# Patient Record
Sex: Female | Born: 1995 | Race: Black or African American | Hispanic: No | Marital: Single | Smoking: Former smoker
Health system: Southern US, Community
[De-identification: ages and names within clinical notes are randomized; demographics above are authoritative.]

## PROBLEM LIST (undated history)

## (undated) DIAGNOSIS — J45909 Unspecified asthma, uncomplicated: Secondary | ICD-10-CM

## (undated) DIAGNOSIS — Z789 Other specified health status: Secondary | ICD-10-CM

## (undated) HISTORY — DX: Other specified health status: Z78.9

## (undated) HISTORY — PX: NO PAST SURGERIES: SHX2092

---
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
## 2017-02-14 ENCOUNTER — Ambulatory Visit: Payer: Medicaid Other

## 2017-10-14 ENCOUNTER — Other Ambulatory Visit: Payer: Self-pay

## 2017-10-14 ENCOUNTER — Emergency Department (HOSPITAL_COMMUNITY)
Admission: EM | Admit: 2017-10-14 | Discharge: 2017-10-14 | Disposition: A | Discharge status: Home / Self Care | Payer: Medicaid Other | Attending: Emergency Medicine | Admitting: Emergency Medicine

## 2017-10-14 ENCOUNTER — Encounter (HOSPITAL_COMMUNITY): Payer: Self-pay | Admitting: Emergency Medicine

## 2017-10-14 ENCOUNTER — Emergency Department (HOSPITAL_COMMUNITY): Payer: Medicaid Other

## 2017-10-14 DIAGNOSIS — R102 Pelvic and perineal pain: Secondary | ICD-10-CM | POA: Diagnosis not present

## 2017-10-14 DIAGNOSIS — N739 Female pelvic inflammatory disease, unspecified: Secondary | ICD-10-CM | POA: Diagnosis not present

## 2017-10-14 DIAGNOSIS — Z79899 Other long term (current) drug therapy: Secondary | ICD-10-CM | POA: Diagnosis not present

## 2017-10-14 DIAGNOSIS — R1084 Generalized abdominal pain: Secondary | ICD-10-CM | POA: Diagnosis present

## 2017-10-14 DIAGNOSIS — R21 Rash and other nonspecific skin eruption: Secondary | ICD-10-CM | POA: Diagnosis not present

## 2017-10-14 DIAGNOSIS — N9489 Other specified conditions associated with female genital organs and menstrual cycle: Secondary | ICD-10-CM | POA: Diagnosis not present

## 2017-10-14 DIAGNOSIS — N730 Acute parametritis and pelvic cellulitis: Secondary | ICD-10-CM

## 2017-10-14 LAB — CBC
HCT: 37 % (ref 36.0–46.0)
HEMOGLOBIN: 11.8 g/dL — AB (ref 12.0–15.0)
MCH: 27.8 pg (ref 26.0–34.0)
MCHC: 31.9 g/dL (ref 30.0–36.0)
MCV: 87.3 fL (ref 78.0–100.0)
Platelets: 448 10*3/uL — ABNORMAL HIGH (ref 150–400)
RBC: 4.24 MIL/uL (ref 3.87–5.11)
RDW: 15.5 % (ref 11.5–15.5)
WBC: 9.5 10*3/uL (ref 4.0–10.5)

## 2017-10-14 LAB — URINALYSIS, ROUTINE W REFLEX MICROSCOPIC
Bacteria, UA: NONE SEEN
Bilirubin Urine: NEGATIVE
GLUCOSE, UA: NEGATIVE mg/dL
Hgb urine dipstick: NEGATIVE
Ketones, ur: 20 mg/dL — AB
Nitrite: NEGATIVE
PROTEIN: 30 mg/dL — AB
Specific Gravity, Urine: 1.032 — ABNORMAL HIGH (ref 1.005–1.030)
pH: 5 (ref 5.0–8.0)

## 2017-10-14 LAB — COMPREHENSIVE METABOLIC PANEL
ALBUMIN: 3.8 g/dL (ref 3.5–5.0)
ALK PHOS: 68 U/L (ref 38–126)
ALT: 10 U/L (ref 0–44)
ANION GAP: 8 (ref 5–15)
AST: 14 U/L — ABNORMAL LOW (ref 15–41)
BUN: 12 mg/dL (ref 6–20)
CALCIUM: 9.1 mg/dL (ref 8.9–10.3)
CO2: 24 mmol/L (ref 22–32)
CREATININE: 0.87 mg/dL (ref 0.44–1.00)
Chloride: 106 mmol/L (ref 98–111)
GFR calc Af Amer: >60 mL/min (ref >60)
GFR calc non Af Amer: >60 mL/min (ref >60)
GLUCOSE: 86 mg/dL (ref 70–99)
Potassium: 3.5 mmol/L (ref 3.5–5.1)
Sodium: 138 mmol/L (ref 135–145)
Total Bilirubin: 0.9 mg/dL (ref 0.3–1.2)
Total Protein: 8 g/dL (ref 6.5–8.1)

## 2017-10-14 LAB — WET PREP, GENITAL
SPERM: NONE SEEN
Trich, Wet Prep: NONE SEEN
YEAST WET PREP: NONE SEEN

## 2017-10-14 LAB — I-STAT BETA HCG BLOOD, ED (MC, WL, AP ONLY)

## 2017-10-14 LAB — LIPASE, BLOOD: Lipase: 30 U/L (ref 11–51)

## 2017-10-14 MED ORDER — HYDROCODONE-ACETAMINOPHEN 5-325 MG PO TABS
1.0000 | ORAL_TABLET | Freq: Once | ORAL | Status: AC
  Administered 2017-10-14: 1 via ORAL
  Filled 2017-10-14: qty 1

## 2017-10-14 MED ORDER — DOXYCYCLINE HYCLATE 100 MG PO CAPS: 100.0000 mg | ORAL_CAPSULE | Freq: Two times a day (BID) | ORAL | 0 refills | Status: DC

## 2017-10-14 MED ORDER — CEFTRIAXONE SODIUM 250 MG IJ SOLR
250.0000 mg | Freq: Once | INTRAMUSCULAR | Status: AC
  Administered 2017-10-14: 250 mg via INTRAMUSCULAR
  Filled 2017-10-14: qty 250

## 2017-10-14 MED ORDER — TRIAMCINOLONE ACETONIDE 0.5 % EX OINT
1.0000 | TOPICAL_OINTMENT | Freq: Two times a day (BID) | CUTANEOUS | 0 refills | Status: DC
Start: 2017-10-14 — End: 2020-03-04

## 2017-10-14 MED ORDER — DOXYCYCLINE HYCLATE 100 MG PO TABS
100.0000 mg | ORAL_TABLET | Freq: Once | ORAL | Status: AC
  Administered 2017-10-14: 100 mg via ORAL
  Filled 2017-10-14: qty 1

## 2017-10-14 MED ORDER — STERILE WATER FOR INJECTION IJ SOLN
INTRAMUSCULAR | Status: AC
  Filled 2017-10-14: qty 10

## 2017-10-14 MED ORDER — STERILE WATER FOR INJECTION IJ SOLN
0.9000 mL | Freq: Once | INTRAMUSCULAR | Status: AC
  Administered 2017-10-14: 0.9 mL via INTRAMUSCULAR

## 2017-10-14 NOTE — ED Notes (Signed)
Pt transported to US

## 2017-10-14 NOTE — ED Triage Notes (Addendum)
C/o sharp generalized abd pain x 2-3 days.  Denies nausea, vomiting, and diarrhea.  Also reports rash on rectum.  Denies urinary complaints.

## 2017-10-14 NOTE — ED Provider Notes (Signed)
Patient signed out to me by Sherlie BanK Gekas, PA-C.  Please see previous notes for further history.  In brief, patient presenting for evaluation of diffuse abdominal pain for several days.  She is concerned she may have an STD.  Additionally, states she has a rash on her buttocks.  On exam, patient had copious yellow discharge with cervical motion tenderness.  Labs and vital signs are reassuring.  Abdominal exam showed diffuse tenderness.  Pelvic ultrasound pending to rule out TOA or other concerning signs of infection.  Patient received Rocephin and doxycycline while in the hospital.  Plan for discharge with doxycycline and triamcinolone cream for infections.  Ultrasound shows no sign of TOA.  Shows dilated vessels concerning for pelvic congestion syndrome.  Discussed with patient.  Discussed if she is having long-term pain, she needs to follow-up with her OB/GYN for further evaluation.  Discussed importance of taking antibiotics.  At this time, patient appears safe for discharge.  Return precautions given.  Patient states she understands and agrees to plan.    Madison Soto, Madison Fritchman, PA-C 10/14/17 2252    Tegeler, Canary Brimhristopher J, MD 10/15/17 [phone]

## 2017-10-14 NOTE — Discharge Instructions (Addendum)
Take Doxycycline twice daily for the next two weeks for vaginal discharge and pelvic pain Use Triamcinolone cream twice daily on rash  Return if worsening Follow up with ob/gyn if you are having long-term pelvic pain

## 2017-10-14 NOTE — ED Provider Notes (Signed)
[facility] EMERGENCY DEPARTMENT Provider Note   CSN: [id number] Arrival date & time: 10/14/17  1716     History   Chief Complaint Chief Complaint  Patient presents with  . Abdominal Pain    HPI Madison Soto is a 22 y.o. female who presents with abdominal pain. No significant PMH. She states that for the past couple of days she has had constant, gradually worsening, generalized abdominal pain. She states it feels like "knots". She also has a rash over her buttocks which is somewhat painful, burning, and itchy. She started googling things and is concerned for STD. She reports copious malodorous vaginal discharge. She has had recent unprotected sex. She denies hx of STD. No fever, vomiting, diarrhea, urinary symptoms. LMP was a week ago.  HPI  Past Medical History:  Diagnosis Date  . Medical history non-contributory     There are no active problems to display for this patient.   Past Surgical History:  Procedure Laterality Date  . NO PAST SURGERIES       OB History    Gravida  1   Para  1   Term  1   Preterm      AB      Living  1     SAB      TAB      Ectopic      Multiple  0   Live Births  1            Home Medications    Prior to Admission medications   Medication Sig Start Date End Date Taking? Authorizing Provider  ibuprofen (ADVIL,MOTRIN) 600 MG tablet Take 1 tablet (600 mg total) by mouth every 6 (six) hours. 08/03/16   Leftwich-Kirby, Wilmer Floor, CNM  Prenatal Vit-Fe Fumarate-FA (PRENATAL MULTIVITAMIN) TABS tablet Take 1 tablet by mouth daily at 12 noon.    [provider]    Family History Family History  Problem Relation Age of Onset  . Hypertension Paternal Grandmother   . Hypertension Paternal Grandfather     Social History Social History   Tobacco Use  . Smoking status: Former Games developer  . Smokeless tobacco: Never Used  Substance Use Topics  . Alcohol use: No  . Drug use: No     Allergies     Patient has no known allergies.   Review of Systems Review of Systems  Constitutional: Positive for appetite change. Negative for activity change, chills and fever.  Respiratory: Negative for shortness of breath.   Cardiovascular: Negative for chest pain.  Gastrointestinal: Positive for abdominal pain. Negative for diarrhea, nausea and vomiting.  Genitourinary: Positive for pelvic pain and vaginal discharge. Negative for difficulty urinating, dysuria, flank pain, frequency and vaginal bleeding.  All other systems reviewed and are negative.    Physical Exam Updated Vital Signs Ht 5\' 11"  (1.803 m)   Wt 68 kg   LMP 10/07/2017   BMI 20.92 kg/m   Physical Exam  Constitutional: She is oriented to person, place, and time. She appears well-developed and well-nourished. No distress.  Calm, cooperative. Well appearing  HENT:  Head: Normocephalic and atraumatic.  Eyes: Pupils are equal, round, and reactive to light. Conjunctivae are normal. Right eye exhibits no discharge. Left eye exhibits no discharge. No scleral icterus.  Neck: Normal range of motion.  Cardiovascular: Normal rate and regular rhythm.  Pulmonary/Chest: Effort normal and breath sounds normal. No respiratory distress.  Abdominal: Soft. Bowel sounds are normal. She exhibits no distension and no  mass. There is tenderness (generalized). There is no rebound and no guarding. No hernia.  Genitourinary:  Genitourinary Comments: Pelvic: No inguinal lymphadenopathy or inguinal hernia noted. Normal external genitalia. No pain with speculum insertion. Closed cervical os with normal appearance - no rash or lesions. Thick yellow discharge on speculum exam. On bimanual examination there is +CMT. No adnexal tenderness. Chaperone present during exam.  Rectal: Superficial erythematous rash consistent with intertrigo.   Neurological: She is alert and oriented to person, place, and time.  Skin: Skin is warm and dry.  Psychiatric: She has a  normal mood and affect. Her behavior is normal.  Nursing note and vitals reviewed.    ED Treatments / Results  Labs (all labs ordered are listed, but only abnormal results are displayed) Labs Reviewed  WET PREP, GENITAL - Abnormal; Notable for the following components:      Result Value   Clue Cells Wet Prep HPF POC PRESENT (*)    WBC, Wet Prep HPF POC MANY (*)    All other components within normal limits  COMPREHENSIVE METABOLIC PANEL - Abnormal; Notable for the following components:   AST 14 (*)    All other components within normal limits  CBC - Abnormal; Notable for the following components:   Hemoglobin 11.8 (*)    Platelets 448 (*)    All other components within normal limits  URINALYSIS, ROUTINE W REFLEX MICROSCOPIC - Abnormal; Notable for the following components:   APPearance HAZY (*)    Specific Gravity, Urine 1.032 (*)    Ketones, ur 20 (*)    Protein, ur 30 (*)    Leukocytes, UA MODERATE (*)    All other components within normal limits  LIPASE, BLOOD  RPR  HIV ANTIBODY (ROUTINE TESTING)  I-STAT BETA HCG BLOOD, ED ([facility], [facility], [facility] ONLY)  GC/CHLAMYDIA PROBE AMP ([place]) NOT AT [facility]    EKG None  Radiology No results found.  Procedures Procedures (including critical care time)  Medications Ordered in ED Medications  HYDROcodone-acetaminophen (NORCO/VICODIN) 5-325 MG per tablet 1 tablet (1 tablet Oral Given 10/14/17 2030)  cefTRIAXone (ROCEPHIN) injection 250 mg (250 mg Intramuscular Given 10/14/17 2140)  doxycycline (VIBRA-TABS) tablet 100 mg (100 mg Oral Given 10/14/17 2140)  sterile water (preservative free) injection 0.9 mL (0.9 mLs Intramuscular Given 10/14/17 2140)     Initial Impression / Assessment and Plan / ED Course  I have reviewed the triage vital signs and the nursing notes.  Pertinent labs & imaging results that were available during my care of the patient were reviewed by me and considered in my medical decision making (see chart for  details).  22 year old female with diffuse abdominal pain and vaginal discharge as well as rash on the buttocks for the past couple days. Her vitals are normal. Labs are unremarkable. UA has 20 ketones, moderate leukocytes, 30 protein, 1.032 specific gravity. On exam she has diffuse tenderness. Pelvic exam is remarkable for copious yellow discharge and +CMT. Will treat for PID and due to significant tenderness, will order Pelvic US to r/o TOA, torsion, etc. The rash on her buttocks appears consistent with intertrigo. She has been using Shea butter and vaseline without relief. Will rx Triamcinolone. At shift change, US is pending. [facility] PA-C who will f/u on results and dispo.  Final Clinical Impressions(s) / ED Diagnoses   Final diagnoses:  PID (acute pelvic inflammatory disease)  Rash and other nonspecific skin eruption    ED Discharge Orders  None       Beryle QuantGekas, Lynnette Pote Marie, PA-C 10/14/17 2209    Bethann BerkshireZammit, Joseph, MD 10/16/17 1213

## 2017-10-14 NOTE — ED Notes (Signed)
Patient transported to Ultrasound 

## 2017-10-15 LAB — GC/CHLAMYDIA PROBE AMP (~~LOC~~) NOT AT ARMC
Chlamydia: POSITIVE — AB
Neisseria Gonorrhea: NEGATIVE

## 2017-10-15 LAB — RPR: RPR Ser Ql: NONREACTIVE

## 2017-10-16 LAB — HIV ANTIBODY (ROUTINE TESTING W REFLEX): HIV SCREEN 4TH GENERATION: NONREACTIVE

## 2017-10-18 ENCOUNTER — Telehealth: Payer: Self-pay | Admitting: Student

## 2017-10-18 DIAGNOSIS — A749 Chlamydial infection, unspecified: Secondary | ICD-10-CM

## 2017-10-18 MED ORDER — AZITHROMYCIN 500 MG PO TABS: 1000.0000 mg | ORAL_TABLET | Freq: Once | ORAL | 0 refills | Status: AC

## 2017-10-18 NOTE — Telephone Encounter (Addendum)
Madison BaldingLauren Soto tested positive for  Chlamydia. Patient was called by RN and allergies and pharmacy confirmed. Rx sent to pharmacy of choice.   Madison HornLawrence, Madison Kubicki, NP 10/18/2017 10:10 AM        ----- Message from Kathe BectonLori S Berdik, RN sent at 10/18/2017  9:56 AM EDT ----- This patient tested positive for:  Chlamydia  She :"has NKDA", I have informed the patient of her results and confirmed her pharmacy is correct in her chart. Please send Rx.   Thank you,   Kathe BectonBerdik, Madison S, RN   [facility].

## 2018-02-14 ENCOUNTER — Other Ambulatory Visit: Payer: Self-pay

## 2018-02-18 ENCOUNTER — Other Ambulatory Visit (HOSPITAL_COMMUNITY)
Admission: RE | Admit: 2018-02-18 | Discharge: 2018-02-18 | Disposition: A | Discharge status: Home / Self Care | Payer: Medicaid Other | Source: Ambulatory Visit | Attending: Advanced Practice Midwife | Admitting: Advanced Practice Midwife

## 2018-02-18 ENCOUNTER — Ambulatory Visit (INDEPENDENT_AMBULATORY_CARE_PROVIDER_SITE_OTHER): Payer: Medicaid Other | Admitting: Advanced Practice Midwife

## 2018-02-18 ENCOUNTER — Encounter: Payer: Self-pay | Admitting: Advanced Practice Midwife

## 2018-02-18 VITALS — BP 93/60 | HR 86 | Ht 66.0 in | Wt 125.1 lb

## 2018-02-18 DIAGNOSIS — Z3009 Encounter for other general counseling and advice on contraception: Secondary | ICD-10-CM | POA: Insufficient documentation

## 2018-02-18 DIAGNOSIS — Z0000 Encounter for general adult medical examination without abnormal findings: Secondary | ICD-10-CM

## 2018-02-18 LAB — POCT PREGNANCY, URINE: Preg Test, Ur: NEGATIVE

## 2018-02-18 MED ORDER — MEDROXYPROGESTERONE ACETATE 150 MG/ML IM SUSP
150.0000 mg | Freq: Once | INTRAMUSCULAR | Status: AC
  Administered 2018-02-18: 150 mg via INTRAMUSCULAR

## 2018-02-18 MED ORDER — METRONIDAZOLE 500 MG PO TABS: 2000.0000 mg | ORAL_TABLET | Freq: Once | ORAL | 0 refills | Status: DC

## 2018-02-18 NOTE — Progress Notes (Addendum)
GYNECOLOGY ANNUAL PREVENTATIVE CARE ENCOUNTER NOTE  Subjective:   Madison Soto is a 22 y.o. [id number] female here for a routine annual gynecologic exam.  Current complaints: none.   Denies abnormal vaginal bleeding, discharge, pelvic pain, problems with intercourse or other gynecologic concerns.   Patient is interested in starting birth control today. She has been on depo in the past, and would like to resume this.   Patient is also concerned because she got a phone call that her partner had trich, and that a rx had been sent to her pharmacy. When she went to pick it up it was not there. Nothing in epic about patient getting a call from here. STD testing will be done today, but will send in RX for flagyl at this time.    Gynecologic History Patient's last menstrual period was 02/15/2018 (approximate). Contraception: none Last Pap: NA, age. Results were: NA Last mammogram: NA, age. Results were: NA  Obstetric History OB History  Gravida Para Term Preterm AB Living  1 1 1     1   SAB TAB Ectopic Multiple Live Births        0 1    # Outcome Date GA Lbr Len/2nd Weight Sex Delivery Anes PTL Lv  1 Term 08/02/16 [id number] 06:55 / 00:23 7 lb 6.5 oz (3.359 kg) M Vag-Spont EPI  LIV    Past Medical History:  Diagnosis Date  . Medical history non-contributory     Past Surgical History:  Procedure Laterality Date  . NO PAST SURGERIES      Current Outpatient Medications on File Prior to Visit  Medication Sig Dispense Refill  . ibuprofen (ADVIL,MOTRIN) 200 MG tablet Take 400-600 mg by mouth every 6 (six) hours as needed for headache (pain).    Marland Kitchen. doxycycline (VIBRAMYCIN) 100 MG capsule Take 1 capsule (100 mg total) by mouth 2 (two) times daily. (Patient not taking: Reported on 02/18/2018) 28 capsule 0  . ibuprofen (ADVIL,MOTRIN) 600 MG tablet Take 1 tablet (600 mg total) by mouth every 6 (six) hours. (Patient not taking: Reported on 10/14/2017) 30 tablet 0  . Prenatal MV & Min w/FA-DHA (PRENATAL  ADULT GUMMY/DHA/FA PO) Take 1 tablet by mouth daily.    Marland Kitchen. triamcinolone ointment (KENALOG) 0.5 % Apply 1 application topically 2 (two) times daily. (Patient not taking: Reported on 02/18/2018) 30 g 0   No current facility-administered medications on file prior to visit.     No Known Allergies  Social History   Socioeconomic History  . Marital status: Single    Spouse name: Not on file  . Number of children: Not on file  . Years of education: Not on file  . Highest education level: Not on file  Occupational History  . Not on file  Social Needs  . Financial resource strain: Not on file  . Food insecurity:    Worry: Not on file    Inability: Not on file  . Transportation needs:    Medical: Not on file    Non-medical: Not on file  Tobacco Use  . Smoking status: Former Games developermoker  . Smokeless tobacco: Never Used  Substance and Sexual Activity  . Alcohol use: No  . Drug use: No  . Sexual activity: Never    Birth control/protection: None  Lifestyle  . Physical activity:    Days per week: Not on file    Minutes per session: Not on file  . Stress: Not on file  Relationships  . Social connections:  Talks on phone: Not on file    Gets together: Not on file    Attends religious service: Not on file    Active member of club or organization: Not on file    Attends meetings of clubs or organizations: Not on file    Relationship status: Not on file  . Intimate partner violence:    Fear of current or ex partner: Not on file    Emotionally abused: Not on file    Physically abused: Not on file    Forced sexual activity: Not on file  Other Topics Concern  . Not on file  Social History Narrative  . Not on file    Family History  Problem Relation Age of Onset  . Hypertension Paternal Grandmother   . Hypertension Paternal Grandfather     The following portions of the patient's history were reviewed and updated as appropriate: allergies, current medications, past family history,  past medical history, past social history, past surgical history and problem list.  Review of Systems Pertinent items noted in HPI and remainder of comprehensive ROS otherwise negative.   Objective:  BP 93/60   Pulse 86   Ht 5\' 6"  (1.676 m)   Wt 125 lb 1.6 oz (56.7 kg)   LMP 02/15/2018 (Approximate)   BMI 20.19 kg/m    Physical Exam  Constitutional: She is oriented to person, place, and time and well-developed, well-nourished, and in no distress. No distress.  HENT:  Head: Normocephalic.  Cardiovascular: Normal rate.  Pulmonary/Chest: Effort normal.  Abdominal: Soft. There is no abdominal tenderness. There is no rebound.  Neurological: She is alert and oriented to person, place, and time.  Skin: Skin is warm and dry.  Psychiatric: Affect normal.  Nursing note and vitals reviewed.   Results for orders placed or performed in visit on 02/18/18 (from the past 24 hour(s))  Pregnancy, urine POC     Status: None   Collection Time: 02/18/18  2:46 PM  Result Value Ref Range   Preg Test, Ur NEGATIVE NEGATIVE    Assessment and Plan:  1. Well woman exam (no gynecological exam)  2. Encounter for counseling regarding contraception - Cervicovaginal ancillary only( [place]) - Start depo today - Return March 10-24 for repeat injection   Routine preventative health maintenance measures emphasized. Please refer to After Visit Summary for other counseling recommendations.   Thressa ShellerHeather Hogan DNP, CNM  02/18/18  2:43 PM

## 2018-02-18 NOTE — Patient Instructions (Signed)
Contraceptive Injection  A contraceptive injection is a shot that prevents pregnancy. It is also called the birth control shot. The shot contains the hormone progestin, which prevents pregnancy by:  · Stopping the ovaries from releasing eggs.  · Thickening cervical mucus to prevent sperm from entering the cervix.  · Thinning the lining of the uterus to prevent a fertilized egg from attaching to the uterus.  Contraceptive injections are given under the skin (subcutaneous) or into a muscle (intramuscular). For these shots to work, you must get one of them every 3 months (12 weeks) from a health care provider.  Tell a health care provider about:  · Any allergies you have.  · All medicines you are taking, including vitamins, herbs, eye drops, creams, and over-the-counter medicines.  · Any blood disorders you have.  · Any medical conditions you have.  · Whether you are pregnant or may be pregnant.  What are the risks?  Generally, this is a safe procedure. However, problems may occur, including:  · Mood changes or depression.  · Loss of bone density (osteoporosis) after long-term use.  · Blood clots.  · Higher risk of an egg being fertilized outside your uterus (ectopic pregnancy).This is rare.  What happens before the procedure?  · Your health care provider may do a routine physical exam.  · You may have a test to make sure you are not pregnant.  What happens during the procedure?  · The area where the shot will be given will be cleaned and sanitized with alcohol.  · A needle will be inserted into a muscle in your upper arm or buttock, or into the skin of your thigh or abdomen. The needle will be attached to a syringe with the medicine inside of it.  · The medicine will be pushed through the syringe and injected into your body.  · A small bandage (dressing) may be placed over the injection site.  What can I expect after the procedure?  · After the procedure, it is common to have:  ? Soreness around the injection site  for a couple of days.  ? Irregular menstrual bleeding.  ? Weight gain.  ? Breast tenderness.  ? Headaches.  ? Discomfort in your abdomen.  · Ask your health care provider whether you need to use an added method of birth control (backup contraception), such as a condom, sponge, or spermicide.  ? If the first shot is given 1-7 days after the start of your last period, you will not need backup contraception.  ? If the first shot is given at any other time during your menstrual cycle, you should avoid having sex or you will need backup contraception for 7 days after you receive the shot.  Follow these instructions at home:  General instructions    · Take over-the-counter and prescription medicines only as told by your health care provider.  · Do not massage the injection site.  · Track your menstrual periods so you will know if they become irregular.  · Always use a condom to protect against STIs (sexually transmitted infections).  · Make sure you schedule an appointment in time for your next shot, and mark it on your calendar. For the birth control to prevent pregnancy, you must get the injections every 3 months (12 weeks).  Lifestyle  · Do not use any products that contain nicotine or tobacco, such as cigarettes and e-cigarettes. If you need help quitting, ask your health care provider.  · Eat foods   that are high in calcium and vitamin D, such as milk, cheese, and salmon. Doing this may help with any loss in bone density that is caused by the contraceptive injection. Ask your health care provider for dietary recommendations.  Contact a health care provider if:  · You have nausea or vomiting.  · You have abnormal vaginal discharge or bleeding.  · You miss a period or you think you might be pregnant.  · You experience mood changes or depression.  · You feel dizzy or light-headed.  · You have leg pain.  Get help right away if:  · You have chest pain.  · You cough up blood.  · You have shortness of breath.  · You have a  severe headache that does not go away.  · You have numbness in any part of your body.  · You have slurred speech.  · You have vision problems.  · You have vaginal bleeding that is abnormally heavy or does not stop.  · You have severe pain in your abdomen.  · You have depression that does not get better with treatment.  If you ever feel like you may hurt yourself or others, or have thoughts about taking your own life, get help right away. You can go to your nearest emergency department or call:  · Your local emergency services (911 in the [place]  · A suicide crisis helpline, such as the National Suicide Prevention Lifeline at 1-800-273-8255. This is open 24 hours a day.  Summary  · A contraceptive injection is a shot that prevents pregnancy. It is also called the birth control shot.  · The shot is given under the skin (subcutaneous) or into a muscle (intramuscular).  · After this procedure, it is common to have soreness around the injection site for a couple of days.  · To prevent pregnancy, the shot must be given by a health care provider every 3 months (12 weeks).  · After you have the shot, ask your health care provider whether you need to use an added method of birth control (backup contraception), such as a condom, sponge, or spermicide.  This information is not intended to replace advice given to you by your health care provider. Make sure you discuss any questions you have with your health care provider.  Document Released: 10/09/2016 Document Revised: 10/09/2016 Document Reviewed: 10/09/2016  Elsevier Interactive Patient Education © 2019 [employer] Inc[employer]

## 2018-02-18 NOTE — Addendum Note (Signed)
Addended by: Henrietta DineNEAL, Bryana Froemming S on: 02/18/2018 03:15 PM   Modules accepted: Orders

## 2018-02-18 NOTE — Progress Notes (Signed)
Pt wants Depo

## 2018-02-19 LAB — CERVICOVAGINAL ANCILLARY ONLY
CHLAMYDIA, DNA PROBE: NEGATIVE
NEISSERIA GONORRHEA: NEGATIVE

## 2018-05-07 ENCOUNTER — Ambulatory Visit: Payer: Medicaid Other

## 2018-12-19 DIAGNOSIS — Z5181 Encounter for therapeutic drug level monitoring: Secondary | ICD-10-CM | POA: Diagnosis not present

## 2018-12-24 DIAGNOSIS — Z5181 Encounter for therapeutic drug level monitoring: Secondary | ICD-10-CM | POA: Diagnosis not present

## 2019-01-02 DIAGNOSIS — Z5181 Encounter for therapeutic drug level monitoring: Secondary | ICD-10-CM | POA: Diagnosis not present

## 2019-01-08 DIAGNOSIS — Z5181 Encounter for therapeutic drug level monitoring: Secondary | ICD-10-CM | POA: Diagnosis not present

## 2019-01-15 DIAGNOSIS — Z5181 Encounter for therapeutic drug level monitoring: Secondary | ICD-10-CM | POA: Diagnosis not present

## 2019-01-21 DIAGNOSIS — Z5181 Encounter for therapeutic drug level monitoring: Secondary | ICD-10-CM | POA: Diagnosis not present

## 2019-01-27 IMAGING — US US TRANSVAGINAL NON-OB
1 series · 13 of 25 positions shown · non-contrast
Comparison: None.

CLINICAL DATA: Initial evaluation for acute pelvic pain.

EXAM:
TRANSABDOMINAL AND TRANSVAGINAL ULTRASOUND OF PELVIS
DOPPLER ULTRASOUND OF OVARIES
TECHNIQUE: Both transabdominal and transvaginal ultrasound examinations of the
pelvis were performed. Transabdominal technique was performed for
global imaging of the pelvis including uterus, ovaries, adnexal
regions, and pelvic cul-de-sac.
It was necessary to proceed with endovaginal exam following the
transabdominal exam to visualize the uterus, endometrium, and
ovaries. Color and duplex Doppler ultrasound was utilized to
evaluate blood flow to the ovaries.

[Series 1: us transvaginal non-ob · 0.21mm/px · 73 acquisitions, 13 frames shown]
[im 1/73]
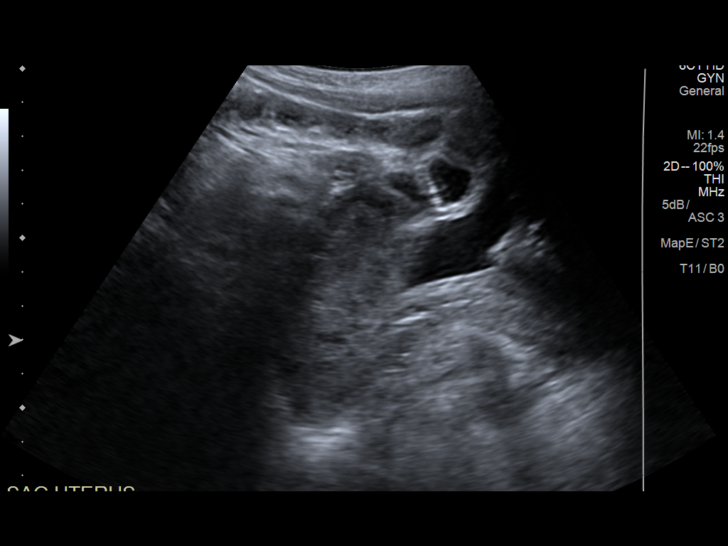
[im 7/73]
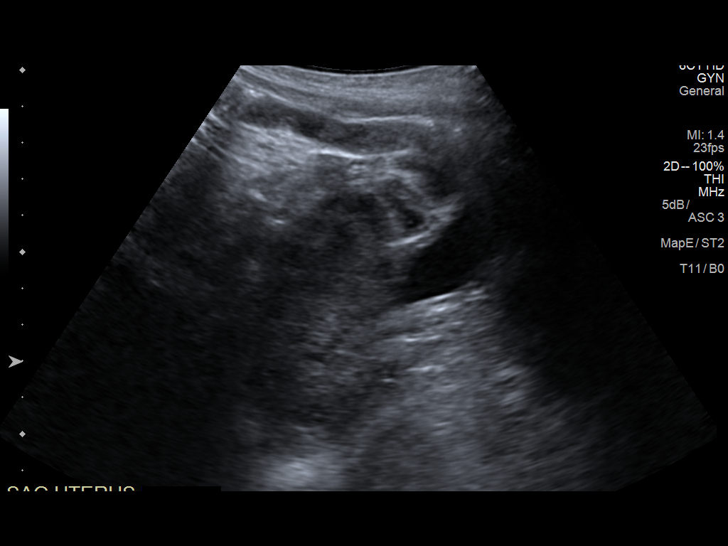
[im 13/73]
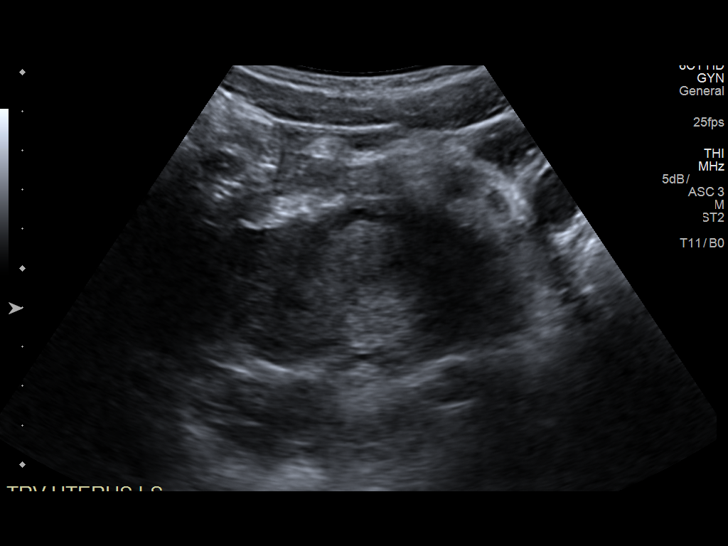
[im 19/73]
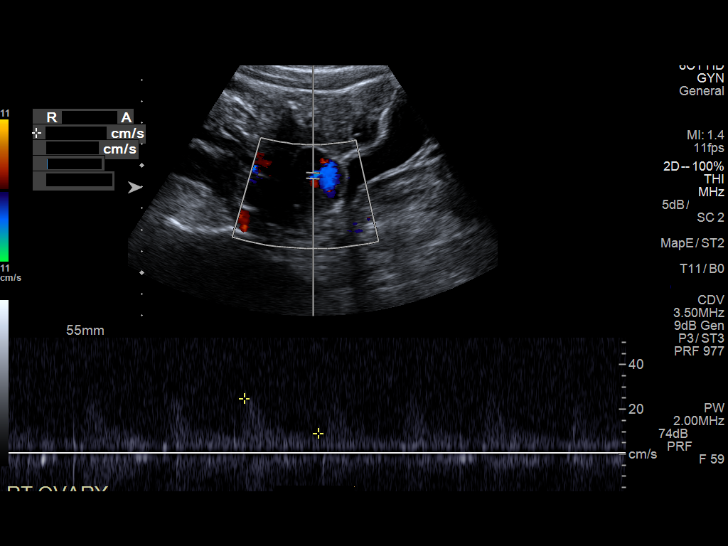
[im 25/73]
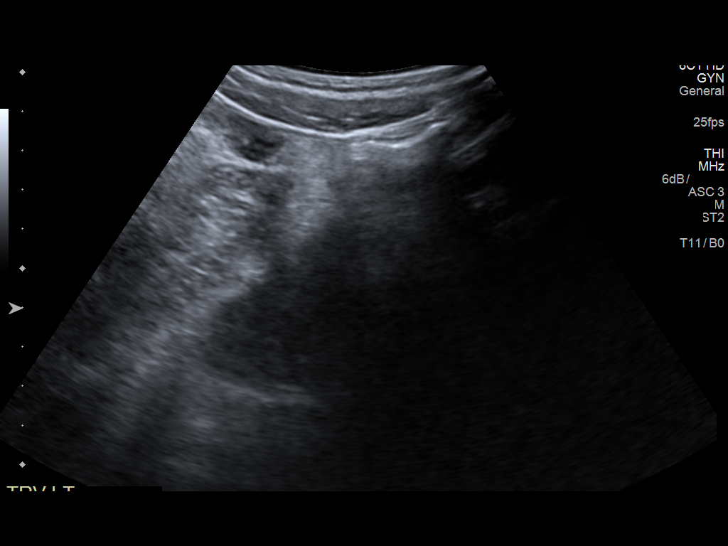
[im 31/73]
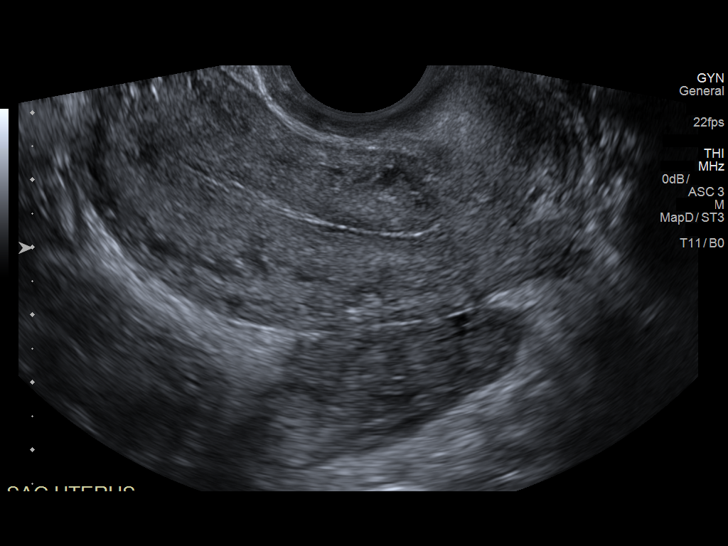
[im 37/73]
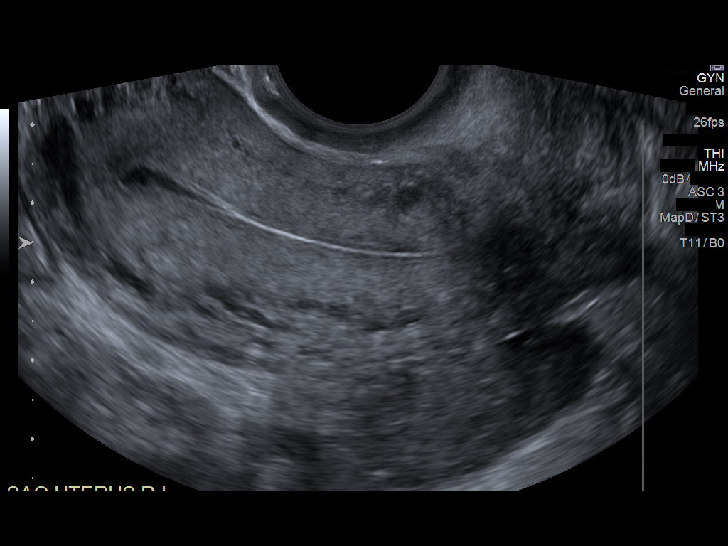
[im 43/73]
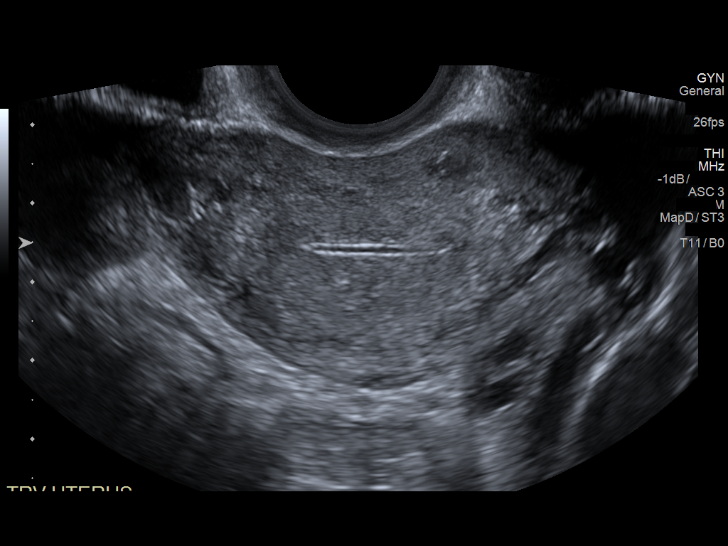
[im 49/73]
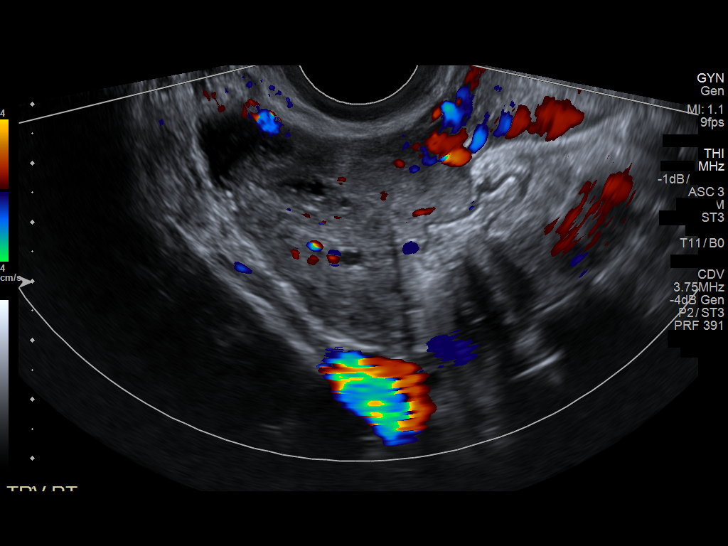
[im 55/73]
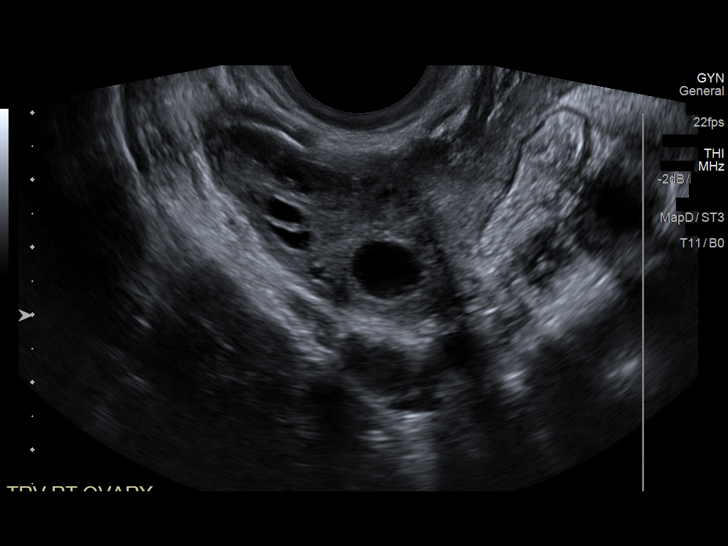
[im 61/73]
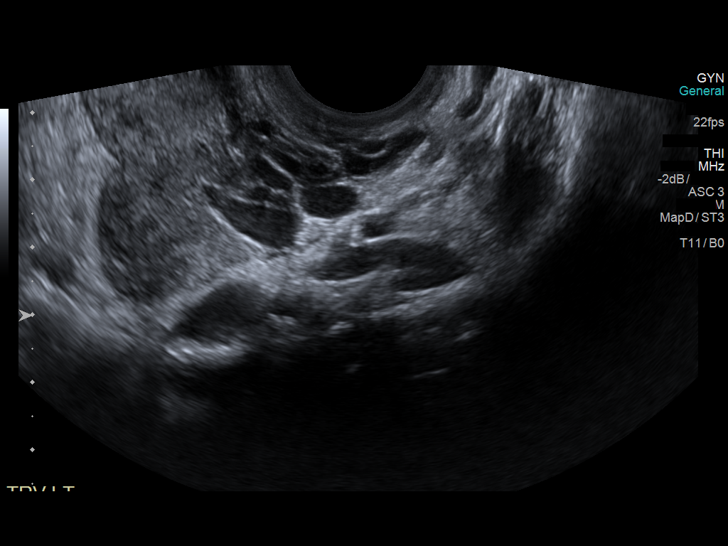
[im 67/73]
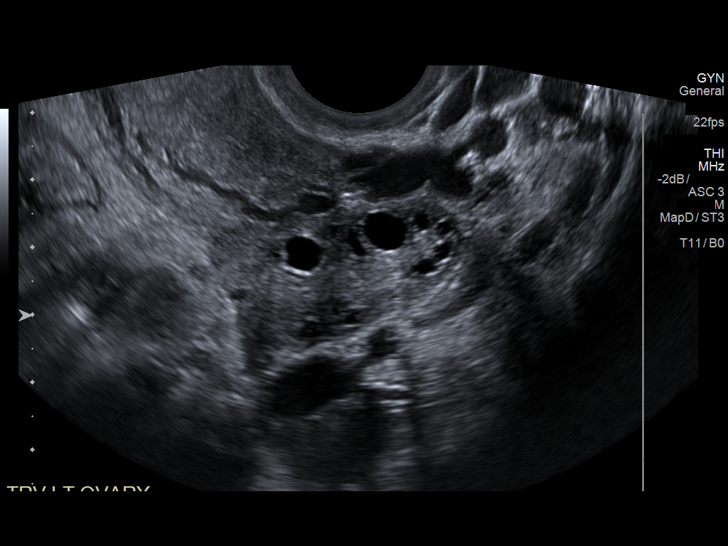
[im 73/73]
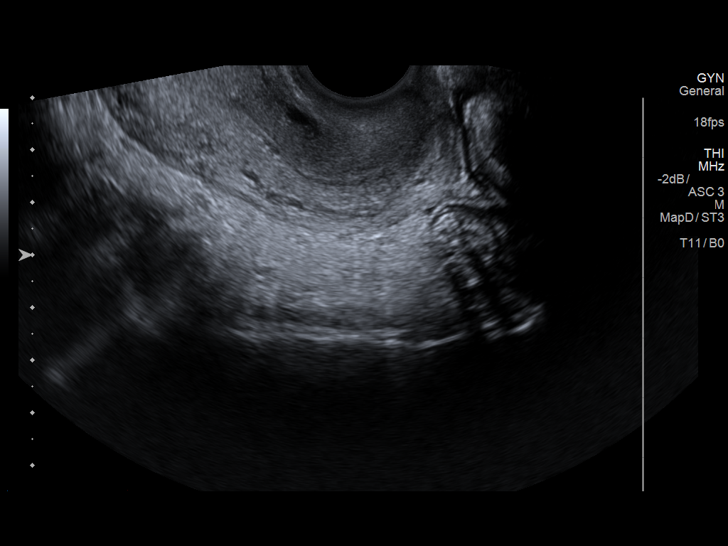

[13 of 25 positions shown; findings below may reference images not displayed]

FINDINGS: Uterus

Measurements: 7.4 x 3.1 x 4.6 cm. No fibroids or other mass
visualized.

Endometrium

Thickness: 2.1 mm.  No focal abnormality visualized.

Right ovary

Measurements: 2.2 x 2.3 x 2.1 cm. Normal appearance/no adnexal mass.
Small follicular cysts measuring up to 1.1 cm noted.

Left ovary

Measurements: 3.5 x 2.2 x 2.3 cm. Normal appearance/no adnexal mass.

Pulsed Doppler evaluation of both ovaries demonstrates normal
low-resistance arterial and venous waveforms.

Other findings

No abnormal free fluid.

Mildly prominent dilated vessel seen within the pelvic adnexa
bilaterally, which can be seen in the setting of pelvic congestion
syndrome.
IMPRESSION: 1. Mildly prominent dilated vessel seen within the pelvic adnexa
bilaterally, which can be seen in the setting of pelvic congestion
syndrome.
2. Otherwise normal pelvic ultrasound. No evidence for torsion or
other acute abnormality.

## 2019-01-28 DIAGNOSIS — Z5181 Encounter for therapeutic drug level monitoring: Secondary | ICD-10-CM | POA: Diagnosis not present

## 2019-02-04 DIAGNOSIS — Z5181 Encounter for therapeutic drug level monitoring: Secondary | ICD-10-CM | POA: Diagnosis not present

## 2019-02-07 ENCOUNTER — Other Ambulatory Visit: Payer: Self-pay

## 2019-02-07 DIAGNOSIS — Z20828 Contact with and (suspected) exposure to other viral communicable diseases: Secondary | ICD-10-CM | POA: Diagnosis not present

## 2019-02-07 DIAGNOSIS — Z20822 Contact with and (suspected) exposure to covid-19: Secondary | ICD-10-CM

## 2019-02-07 NOTE — Addendum Note (Signed)
Addended by: Luisa Dago A on: 02/07/2019 03:53 PM   Modules accepted: Orders

## 2019-02-08 LAB — NOVEL CORONAVIRUS, NAA: SARS-CoV-2, NAA: NOT DETECTED

## 2019-02-10 ENCOUNTER — Other Ambulatory Visit: Payer: Self-pay

## 2019-02-10 DIAGNOSIS — Z20828 Contact with and (suspected) exposure to other viral communicable diseases: Secondary | ICD-10-CM | POA: Diagnosis not present

## 2019-02-10 DIAGNOSIS — Z20822 Contact with and (suspected) exposure to covid-19: Secondary | ICD-10-CM

## 2019-02-11 DIAGNOSIS — Z5181 Encounter for therapeutic drug level monitoring: Secondary | ICD-10-CM | POA: Diagnosis not present

## 2019-02-12 LAB — NOVEL CORONAVIRUS, NAA: SARS-CoV-2, NAA: NOT DETECTED

## 2019-03-04 DIAGNOSIS — Z5181 Encounter for therapeutic drug level monitoring: Secondary | ICD-10-CM | POA: Diagnosis not present

## 2019-03-11 DIAGNOSIS — Z5181 Encounter for therapeutic drug level monitoring: Secondary | ICD-10-CM | POA: Diagnosis not present

## 2019-03-17 DIAGNOSIS — Z5181 Encounter for therapeutic drug level monitoring: Secondary | ICD-10-CM | POA: Diagnosis not present

## 2019-03-24 DIAGNOSIS — Z5181 Encounter for therapeutic drug level monitoring: Secondary | ICD-10-CM | POA: Diagnosis not present

## 2019-04-01 DIAGNOSIS — Z5181 Encounter for therapeutic drug level monitoring: Secondary | ICD-10-CM | POA: Diagnosis not present

## 2019-04-07 DIAGNOSIS — Z5181 Encounter for therapeutic drug level monitoring: Secondary | ICD-10-CM | POA: Diagnosis not present

## 2019-04-15 DIAGNOSIS — Z5181 Encounter for therapeutic drug level monitoring: Secondary | ICD-10-CM | POA: Diagnosis not present

## 2019-04-30 ENCOUNTER — Other Ambulatory Visit: Payer: Self-pay

## 2019-04-30 ENCOUNTER — Emergency Department (HOSPITAL_COMMUNITY)
Admission: EM | Admit: 2019-04-30 | Discharge: 2019-04-30 | Disposition: A | Discharge status: Home / Self Care | Payer: Medicaid Other | Attending: Emergency Medicine | Admitting: Emergency Medicine

## 2019-04-30 ENCOUNTER — Encounter (HOSPITAL_COMMUNITY): Payer: Self-pay

## 2019-04-30 DIAGNOSIS — N898 Other specified noninflammatory disorders of vagina: Secondary | ICD-10-CM

## 2019-04-30 DIAGNOSIS — Z87891 Personal history of nicotine dependence: Secondary | ICD-10-CM | POA: Insufficient documentation

## 2019-04-30 DIAGNOSIS — N899 Noninflammatory disorder of vagina, unspecified: Secondary | ICD-10-CM | POA: Diagnosis not present

## 2019-04-30 LAB — URINALYSIS, ROUTINE W REFLEX MICROSCOPIC
Bilirubin Urine: NEGATIVE
Glucose, UA: NEGATIVE mg/dL
Ketones, ur: NEGATIVE mg/dL
Leukocytes,Ua: NEGATIVE
Nitrite: NEGATIVE
Protein, ur: 30 mg/dL — AB
RBC / HPF: >50 RBC/hpf — ABNORMAL HIGH (ref 0–5)
Specific Gravity, Urine: 1.024 (ref 1.005–1.030)
pH: 7 (ref 5.0–8.0)

## 2019-04-30 LAB — WET PREP, GENITAL
Clue Cells Wet Prep HPF POC: NONE SEEN
Sperm: NONE SEEN
Trich, Wet Prep: NONE SEEN
Yeast Wet Prep HPF POC: NONE SEEN

## 2019-04-30 LAB — POC URINE PREG, ED: Preg Test, Ur: NEGATIVE

## 2019-04-30 LAB — HIV ANTIBODY (ROUTINE TESTING W REFLEX): HIV Screen 4th Generation wRfx: NONREACTIVE

## 2019-04-30 NOTE — ED Provider Notes (Signed)
[facility] EMERGENCY DEPARTMENT Provider Note   CSN: [id number] Arrival date & time: 04/30/19  1310     History Chief Complaint  Patient presents with  . Exposure to STD    Madison Soto is a 24 y.o. female.  HPI      Madison Soto is a 24 y.o. female, with a history of gonorrhea and chlamydia, presenting to the ED with change in vaginal discharge for the last two weeks.  She states, "I do not know it just looks a little different." Her boyfriend came in this morning to be tested for STDs and she would like to be tested as well. She is currently menstruating. Denies fever/chills, nausea/vomiting, abdominal pain, dyspareunia, abnormal vaginal bleeding, urinary symptoms, or any other complaints.      Past Medical History:  Diagnosis Date  . Medical history non-contributory     There are no problems to display for this patient.   Past Surgical History:  Procedure Laterality Date  . NO PAST SURGERIES       OB History    Gravida  1   Para  1   Term  1   Preterm      AB      Living  1     SAB      TAB      Ectopic      Multiple  0   Live Births  1           Family History  Problem Relation Age of Onset  . Hypertension Paternal Grandmother   . Hypertension Paternal Grandfather     Social History   Tobacco Use  . Smoking status: Former Games developer  . Smokeless tobacco: Never Used  Substance Use Topics  . Alcohol use: No  . Drug use: No    Home Medications Prior to Admission medications   Medication Sig Start Date End Date Taking? Authorizing Provider  doxycycline (VIBRAMYCIN) 100 MG capsule Take 1 capsule (100 mg total) by mouth 2 (two) times daily. Patient not taking: Reported on 02/18/2018 10/14/17   Bethel Born, PA-C  ibuprofen (ADVIL,MOTRIN) 200 MG tablet Take 400-600 mg by mouth every 6 (six) hours as needed for headache (pain).    [provider]  ibuprofen (ADVIL,MOTRIN) 600 MG tablet Take 1  tablet (600 mg total) by mouth every 6 (six) hours. Patient not taking: Reported on 10/14/2017 08/03/16   Hurshel Party, CNM  Prenatal MV & Min w/FA-DHA (PRENATAL ADULT GUMMY/DHA/FA PO) Take 1 tablet by mouth daily.    [provider]  triamcinolone ointment (KENALOG) 0.5 % Apply 1 application topically 2 (two) times daily. Patient not taking: Reported on 02/18/2018 10/14/17   Bethel Born, PA-C    Allergies    Patient has no known allergies.  Review of Systems   Review of Systems  Constitutional: Negative for chills and fever.  Respiratory: Negative for shortness of breath.   Cardiovascular: Negative for chest pain.  Gastrointestinal: Negative for abdominal pain, diarrhea, nausea and vomiting.  Genitourinary: Negative for dyspareunia, dysuria, flank pain, frequency, hematuria and pelvic pain.  Musculoskeletal: Negative for back pain.  Neurological: Negative for syncope and weakness.  All other systems reviewed and are negative.   Physical Exam Updated Vital Signs BP 111/80   Pulse 70   Temp 98.2 F (36.8 C) (Oral)   Resp 18   SpO2 96%   Physical Exam Vitals and nursing note reviewed.  Constitutional:  General: She is not in acute distress.    Appearance: She is well-developed. She is not diaphoretic.  HENT:     Head: Normocephalic and atraumatic.     Mouth/Throat:     Mouth: Mucous membranes are moist.     Pharynx: Oropharynx is clear.  Eyes:     Conjunctiva/sclera: Conjunctivae normal.  Cardiovascular:     Rate and Rhythm: Normal rate and regular rhythm.     Pulses: Normal pulses.          Radial pulses are 2+ on the right side and 2+ on the left side.     Comments: Tactile temperature in the extremities appropriate and equal bilaterally. Pulmonary:     Effort: Pulmonary effort is normal. No respiratory distress.  Abdominal:     Palpations: Abdomen is soft.     Tenderness: There is no abdominal tenderness. There is no guarding.   Genitourinary:    Comments: External genitalia normal Vagina with blood in the vaginal vault consistent with menstruation.  No abnormal discharge noted. Cervix  normal negative for cervical motion tenderness Adnexa palpated, no masses, negative for tenderness noted Bladder palpated negative for tenderness Uterus palpated no masses, negative for tenderness  No inguinal lymphadenopathy. Otherwise normal female genitalia. Med Tech, Inetta Fermo, served as Biomedical engineer during exam. Musculoskeletal:     Cervical back: Neck supple.     Right lower leg: No edema.     Left lower leg: No edema.  Skin:    General: Skin is warm and dry.  Neurological:     Mental Status: She is alert.  Psychiatric:        Mood and Affect: Mood and affect normal.        Speech: Speech normal.        Behavior: Behavior normal.     ED Results / Procedures / Treatments   Labs (all labs ordered are listed, but only abnormal results are displayed) Labs Reviewed  WET PREP, GENITAL - Abnormal; Notable for the following components:      Result Value   WBC, Wet Prep HPF POC FEW (*)    All other components within normal limits  URINALYSIS, ROUTINE W REFLEX MICROSCOPIC - Abnormal; Notable for the following components:   APPearance HAZY (*)    Hgb urine dipstick LARGE (*)    Protein, ur 30 (*)    RBC / HPF >50 (*)    Bacteria, UA RARE (*)    All other components within normal limits  RPR  HIV ANTIBODY (ROUTINE TESTING W REFLEX)  POC URINE PREG, ED  GC/CHLAMYDIA PROBE AMP ([place]) NOT AT [facility]    EKG None  Radiology No results found.  Procedures Pelvic exam  Date/Time: 04/30/2019 3:07 PM Performed by: Anselm Pancoast, PA-C Authorized by: Anselm Pancoast, PA-C  Consent: Verbal consent obtained. Risks and benefits: risks, benefits and alternatives were discussed Consent given by: patient Patient understanding: patient states understanding of the procedure being performed Patient identity confirmed: verbally with  patient and provided demographic data Local anesthesia used: no  Anesthesia: Local anesthesia used: no  Sedation: Patient sedated: no  Patient tolerance: patient tolerated the procedure well with no immediate complications    (including critical care time)  Medications Ordered in ED Medications - No data to display  ED Course  I have reviewed the triage vital signs and the nursing notes.  Pertinent labs & imaging results that were available during my care of the patient were reviewed by me and  considered in my medical decision making (see chart for details).    MDM Rules/Calculators/A&P                      Patient presents with change in vaginal discharge over the last couple weeks.  Pregnancy test negative.  Pelvic exam complicated by patient currently menstruating. STD test results pending.    Final Clinical Impression(s) / ED Diagnoses Final diagnoses:  Vaginal discharge    Rx / DC Orders ED Discharge Orders    None       Layla Maw 04/30/19 1544    Dorie Rank, MD 04/30/19 1900

## 2019-04-30 NOTE — ED Notes (Signed)
Pt discharged at this time. Discharge instructions reviewed, opportunity to ask questions provided, pt verbalizes understanding of discharge instructions.   

## 2019-04-30 NOTE — ED Triage Notes (Signed)
Pt requesting STD check, exposed to partner who was positive

## 2019-04-30 NOTE — ED Notes (Signed)
Got patient into a gown patient is resting with call bell in reach 

## 2019-04-30 NOTE — Discharge Instructions (Addendum)
Wet prep was reassuring. Test results for gonorrhea, chlamydia, HIV, and syphilis are pending.  If these results are abnormal typically they will be called to you. Follow-up with OB/GYN for any further management of this issue.

## 2019-05-01 LAB — GC/CHLAMYDIA PROBE AMP (~~LOC~~) NOT AT ARMC
Chlamydia: POSITIVE — AB
Neisseria Gonorrhea: POSITIVE — AB

## 2019-05-01 LAB — RPR: RPR Ser Ql: NONREACTIVE

## 2019-05-05 ENCOUNTER — Other Ambulatory Visit: Payer: Self-pay

## 2019-05-05 ENCOUNTER — Emergency Department (HOSPITAL_COMMUNITY)
Admission: EM | Admit: 2019-05-05 | Discharge: 2019-05-05 | Disposition: A | Discharge status: Home / Self Care | Payer: Medicaid Other | Attending: Emergency Medicine | Admitting: Emergency Medicine

## 2019-05-05 DIAGNOSIS — Z87891 Personal history of nicotine dependence: Secondary | ICD-10-CM | POA: Diagnosis not present

## 2019-05-05 DIAGNOSIS — R1030 Lower abdominal pain, unspecified: Secondary | ICD-10-CM | POA: Insufficient documentation

## 2019-05-05 DIAGNOSIS — A749 Chlamydial infection, unspecified: Secondary | ICD-10-CM | POA: Diagnosis not present

## 2019-05-05 DIAGNOSIS — A549 Gonococcal infection, unspecified: Secondary | ICD-10-CM | POA: Insufficient documentation

## 2019-05-05 DIAGNOSIS — N739 Female pelvic inflammatory disease, unspecified: Secondary | ICD-10-CM | POA: Diagnosis not present

## 2019-05-05 DIAGNOSIS — N898 Other specified noninflammatory disorders of vagina: Secondary | ICD-10-CM | POA: Insufficient documentation

## 2019-05-05 MED ORDER — DOXYCYCLINE HYCLATE 100 MG PO CAPS: 100.0000 mg | ORAL_CAPSULE | Freq: Two times a day (BID) | ORAL | 0 refills | Status: AC

## 2019-05-05 MED ORDER — LIDOCAINE HCL (PF) 1 % IJ SOLN
INTRAMUSCULAR | Status: AC
  Administered 2019-05-05: 1.5 mL
  Filled 2019-05-05: qty 5

## 2019-05-05 MED ORDER — CEFTRIAXONE SODIUM 500 MG IJ SOLR
500.0000 mg | Freq: Once | INTRAMUSCULAR | Status: AC
  Administered 2019-05-05: 500 mg via INTRAMUSCULAR
  Filled 2019-05-05: qty 500

## 2019-05-05 MED ORDER — DOXYCYCLINE HYCLATE 100 MG PO TABS
100.0000 mg | ORAL_TABLET | Freq: Once | ORAL | Status: AC
  Administered 2019-05-05: 100 mg via ORAL
  Filled 2019-05-05: qty 1

## 2019-05-05 NOTE — ED Notes (Signed)
Pt states she just got a call regarding her test results. Reports mild lower abdominal pain.

## 2019-05-05 NOTE — ED Triage Notes (Signed)
Pt says her MyChart account says she tested positive for gonorrhea and chlamydia and is here for tx. She has not received a call from the hospital with results.

## 2019-05-05 NOTE — Discharge Instructions (Addendum)
Follow up with [facility] Department STD clinic to be screened for HIV in the future and for future STD concerns or screenings. This is the recommendation by the CDC for people with multiple sexual partners or hx of STDs.   You are being treated for gonorrhea and chlamydia. Please take all of your antibiotics until finished!   Take your antibiotics with food.  Common side effects of antibiotics include nausea, vomiting, abdominal discomfort, and diarrhea. You may help offset some of this with probiotics which you can buy or get in yogurt. Do not eat  or take the probiotics until 2 hours after your antibiotic.    Some studies suggest that certain antibiotics can reduce the efficacy of certain oral contraceptive pills (birth control), so please use additional contraceptives (condoms or other barrier method) while you are taking the antibiotics and for an additional 5 to 7 days afterwards if you are a female on these medications.  You can take 1 to 2 tablets of Tylenol (350mg -1000mg  depending on the dose) every 6 hours as needed for pain.  Do not exceed 4000 mg of Tylenol daily.  If your pain persists you can take a dose of ibuprofen in between doses of Tylenol.  I usually recommend 400 to 600 mg of ibuprofen every 6 hours.  Take this with food to avoid upset stomach issues.  Abstain from sexual intercourse for 14 days while undergoing treatment.   Return to the emergency department if any concerning signs or symptoms develop such as fevers, persistent vomiting, severe worsening abdominal pain.

## 2019-05-05 NOTE — ED Notes (Signed)
Pt verbalized understanding of discharge instructions. Follow up care and prescriptions reviewed, pt had no further questions. 

## 2019-05-05 NOTE — ED Provider Notes (Signed)
[facility] EMERGENCY DEPARTMENT Provider Note   CSN: [id number] Arrival date & time: 05/05/19  1236     History Chief Complaint  Patient presents with  . SEXUALLY TRANSMITTED DISEASE    Madison Soto is a 24 y.o. female presents for evaluation of acute onset, persistent vaginal discharge for several days and lower abdominal pain since yesterday.  She was seen and evaluated in the ED on 04/30/2019 for abnormal vaginal discharge, noted that her significant other was seen same day with concern for STD so she wanted testing as well.  Today she saw on her MyChart that her gonorrhea and Chlamydia cultures were positive so she came seeking treatment.  She reports that her abnormal vaginal discharge is persistent, describes it as thick and clumpy.  Yesterday noticed development of constant aching lower abdominal pain.  Does not radiate, no aggravating or alleviating factors noted.  She denies any fever, nausea, vomiting, urinary symptoms, diarrhea, or constipation.  She reports that she and her significant other just started dating recently but reports planning on abstaining from any sexual intercourse until her treatment is completed.  The history is provided by the patient.       Past Medical History:  Diagnosis Date  . Medical history non-contributory     There are no problems to display for this patient.   Past Surgical History:  Procedure Laterality Date  . NO PAST SURGERIES       OB History    Gravida  1   Para  1   Term  1   Preterm      AB      Living  1     SAB      TAB      Ectopic      Multiple  0   Live Births  1           Family History  Problem Relation Age of Onset  . Hypertension Paternal Grandmother   . Hypertension Paternal Grandfather     Social History   Tobacco Use  . Smoking status: Former Games developer  . Smokeless tobacco: Never Used  Substance Use Topics  . Alcohol use: No  . Drug use: No    Home  Medications Prior to Admission medications   Medication Sig Start Date End Date Taking? Authorizing Provider  doxycycline (VIBRAMYCIN) 100 MG capsule Take 1 capsule (100 mg total) by mouth 2 (two) times daily for 14 days. 05/05/19 05/19/19  Michela Pitcher A, PA-C  ibuprofen (ADVIL,MOTRIN) 200 MG tablet Take 400-600 mg by mouth every 6 (six) hours as needed for headache (pain).    [provider]  ibuprofen (ADVIL,MOTRIN) 600 MG tablet Take 1 tablet (600 mg total) by mouth every 6 (six) hours. Patient not taking: Reported on 10/14/2017 08/03/16   Hurshel Party, CNM  Prenatal MV & Min w/FA-DHA (PRENATAL ADULT GUMMY/DHA/FA PO) Take 1 tablet by mouth daily.    [provider]  triamcinolone ointment (KENALOG) 0.5 % Apply 1 application topically 2 (two) times daily. Patient not taking: Reported on 02/18/2018 10/14/17   Bethel Born, PA-C    Allergies    Patient has no known allergies.  Review of Systems   Review of Systems  Constitutional: Negative for chills and fever.  Respiratory: Negative for shortness of breath.   Cardiovascular: Negative for chest pain.  Gastrointestinal: Positive for abdominal pain. Negative for constipation, diarrhea, nausea and vomiting.  Genitourinary: Positive for vaginal discharge. Negative for frequency,  vaginal bleeding and vaginal pain.  All other systems reviewed and are negative.   Physical Exam Updated Vital Signs BP 120/81 (BP Location: Right Arm)   Pulse 62   Temp 98.4 F (36.9 C) (Oral)   Resp 16   LMP 05/03/2019 (Exact Date)   SpO2 100%   Physical Exam Vitals and nursing note reviewed.  Constitutional:      General: She is not in acute distress.    Appearance: She is well-developed.     Comments: Resting very comfortably  HENT:     Head: Normocephalic and atraumatic.  Eyes:     General:        Right eye: No discharge.        Left eye: No discharge.     Conjunctiva/sclera: Conjunctivae normal.  Neck:      Vascular: No JVD.     Trachea: No tracheal deviation.  Cardiovascular:     Rate and Rhythm: Normal rate and regular rhythm.     Heart sounds: Normal heart sounds.  Pulmonary:     Effort: Pulmonary effort is normal.     Breath sounds: Normal breath sounds.  Abdominal:     General: Abdomen is flat. Bowel sounds are normal. There is no distension.     Palpations: Abdomen is soft.     Tenderness: There is abdominal tenderness. There is no right CVA tenderness, left CVA tenderness, guarding or rebound.     Comments: Mild generalized discomfort on palpation, no focal tenderness.  Genitourinary:    Comments: Deferred Musculoskeletal:     Cervical back: Normal range of motion and neck supple.  Skin:    General: Skin is warm and dry.     Findings: No erythema.  Neurological:     Mental Status: She is alert.  Psychiatric:        Behavior: Behavior normal.     ED Results / Procedures / Treatments   Labs (all labs ordered are listed, but only abnormal results are displayed) Labs Reviewed - No data to display  EKG None  Radiology No results found.  Procedures Procedures (including critical care time)  Medications Ordered in ED Medications  cefTRIAXone (ROCEPHIN) injection 500 mg (has no administration in time range)  doxycycline (VIBRA-TABS) tablet 100 mg (has no administration in time range)    ED Course  I have reviewed the triage vital signs and the nursing notes.  Pertinent labs & imaging results that were available during my care of the patient were reviewed by me and considered in my medical decision making (see chart for details).    MDM Rules/Calculators/A&P                      Patient presenting for evaluation of persistent vaginal discharge, noticed some mild generalized abdominal cramping yesterday.  Abdomen is soft with no rebound or guarding on examination, notes some generalized soreness.  She is afebrile and vital signs are stable.  She is overall quite  well-appearing.  Has had no nausea or vomiting.  Tested positive for gonorrhea and chlamydia after cultures were obtained 5 days ago.  She is here for treatment.  Given the abdominal discomfort she is experiencing I think it is reasonable to treat for PID.  I highly doubt acute surgical abdominal pathology, ovarian torsion, TOA, or ectopic pregnancy.  She just completed her menstrual cycle a few days ago and her pregnancy test was negative 5 days ago.  She received a dose of IM Rocephin  in the ED and will discharge with course of doxycycline.  Discussed safe sex practices and recommendations in place by the [facility] for further testing and treatment.  Recommend follow-up with health department for future testing and treatment.  Discussed strict ED return precautions. Patient verbalized understanding of and agreement with plan and is safe for discharge home at this time.   Final Clinical Impression(s) / ED Diagnoses Final diagnoses:  Vaginal discharge  Gonorrhea in female  Chlamydia infection    Rx / DC Orders ED Discharge Orders         Ordered    doxycycline (VIBRAMYCIN) 100 MG capsule  2 times daily     05/05/19 [address] [place] A, PA-C 05/05/19 1436    Pattricia Boss, MD 05/05/19 1905

## 2019-06-07 ENCOUNTER — Encounter (HOSPITAL_COMMUNITY): Payer: Self-pay | Admitting: Emergency Medicine

## 2019-06-07 ENCOUNTER — Other Ambulatory Visit: Payer: Self-pay

## 2019-06-07 ENCOUNTER — Emergency Department (HOSPITAL_COMMUNITY)
Admission: EM | Admit: 2019-06-07 | Discharge: 2019-06-07 | Disposition: A | Discharge status: Home / Self Care | Payer: Medicaid Other | Attending: Emergency Medicine | Admitting: Emergency Medicine

## 2019-06-07 DIAGNOSIS — N764 Abscess of vulva: Secondary | ICD-10-CM | POA: Diagnosis not present

## 2019-06-07 DIAGNOSIS — Z79899 Other long term (current) drug therapy: Secondary | ICD-10-CM | POA: Diagnosis not present

## 2019-06-07 DIAGNOSIS — Z87891 Personal history of nicotine dependence: Secondary | ICD-10-CM | POA: Insufficient documentation

## 2019-06-07 MED ORDER — CEPHALEXIN 500 MG PO CAPS: 500.0000 mg | ORAL_CAPSULE | Freq: Two times a day (BID) | ORAL | 0 refills | Status: AC

## 2019-06-07 MED ORDER — LIDOCAINE HCL (PF) 1 % IJ SOLN
INTRAMUSCULAR | Status: AC
  Administered 2019-06-07: 5 mL
  Filled 2019-06-07: qty 5

## 2019-06-07 NOTE — Discharge Instructions (Addendum)
Take the antibiotics as prescribed.  You may take Tylenol and ibuprofen as needed for pain.  It is very important that this abscess keeps draining.  You need this wound to remain open.  As such I would recommend warm baths 4 times daily and warm compress.  You will need wound recheck in 2 days.  This may be done at urgent care, OB/GYN, here in the emergency department.  Return here for any new or worsening symptoms

## 2019-06-07 NOTE — ED Notes (Signed)
Patient verbalized understanding of dc instructions, vss, ambulatory with nad.   

## 2019-06-07 NOTE — ED Triage Notes (Signed)
Patient reports worsening skin abscess at left groin onset last week , denies discharge /no fever or chills .

## 2019-06-07 NOTE — ED Provider Notes (Signed)
[facility] EMERGENCY DEPARTMENT Provider Note   CSN: [id number] Arrival date & time: 06/07/19  2703     History Chief Complaint  Patient presents with  . Abscess    Madison Soto is a 24 y.o. female with no significant past medical history who presents for evaluation of abscess.  Patient states she has recurrent abscesses.  Usually associated with shaving.  Was previously using Carlton Adam however recently started using razors again due to significant other preference.  Patient states she has had an abscess to left labia which started last week.  Notes some swelling and some tenderness.  No redness or warmth.  She denies any pelvic pain or vaginal discharge.  States these are similar to her prior abscesses.  She is not followed by OB/GYN.  Denies fever, chills, nausea, vomiting, chest pain, shortness of breath, abdominal pain, pelvic pain, vaginal discharge, concerns for STDs.  Denies aggravating or alleviating factors. Rates pain a 7/10 worse with palpation.  Last tetanus 2018  History obtained from patient and past medical records.  No interpreter is used  HPI     Past Medical History:  Diagnosis Date  . Medical history non-contributory     There are no problems to display for this patient.   Past Surgical History:  Procedure Laterality Date  . NO PAST SURGERIES       OB History    Gravida  1   Para  1   Term  1   Preterm      AB      Living  1     SAB      TAB      Ectopic      Multiple  0   Live Births  1           Family History  Problem Relation Age of Onset  . Hypertension Paternal Grandmother   . Hypertension Paternal Grandfather     Social History   Tobacco Use  . Smoking status: Former Research scientist (life sciences)  . Smokeless tobacco: Never Used  Substance Use Topics  . Alcohol use: No  . Drug use: No    Home Medications Prior to Admission medications   Medication Sig Start Date End Date Taking? Authorizing Provider  cephALEXin  (KEFLEX) 500 MG capsule Take 1 capsule (500 mg total) by mouth 2 (two) times daily for 5 days. 06/07/19 06/12/19  Jivan Symanski A, PA-C  ibuprofen (ADVIL,MOTRIN) 200 MG tablet Take 400-600 mg by mouth every 6 (six) hours as needed for headache (pain).    [provider]  ibuprofen (ADVIL,MOTRIN) 600 MG tablet Take 1 tablet (600 mg total) by mouth every 6 (six) hours. Patient not taking: Reported on 10/14/2017 08/03/16   Elvera Maria, CNM  Prenatal MV & Min w/FA-DHA (PRENATAL ADULT GUMMY/DHA/FA PO) Take 1 tablet by mouth daily.    [provider]  triamcinolone ointment (KENALOG) 0.5 % Apply 1 application topically 2 (two) times daily. Patient not taking: Reported on 02/18/2018 10/14/17   Recardo Evangelist, PA-C    Allergies    Patient has no known allergies.  Review of Systems   Review of Systems  Constitutional: Negative.   HENT: Negative.   Respiratory: Negative.   Cardiovascular: Negative.   Gastrointestinal: Negative.   Genitourinary: Negative for decreased urine volume, difficulty urinating, dysuria, flank pain, frequency, menstrual problem, pelvic pain, urgency, vaginal bleeding, vaginal discharge and vaginal pain.       Left labial pain and swelling  Musculoskeletal: Negative.   Skin: Negative.   Neurological: Negative.   All other systems reviewed and are negative.   Physical Exam Updated Vital Signs BP 111/64 (BP Location: Left Arm)   Pulse 79   Temp 98.2 F (36.8 C) (Oral)   Resp 18   Ht 5\' 6"  (1.676 m)   Wt 75 kg   LMP 05/29/2019   SpO2 97%   BMI 26.69 kg/m   Physical Exam Vitals and nursing note reviewed. Exam conducted with a chaperone present.  Constitutional:      General: She is not in acute distress.    Appearance: She is well-developed. She is not ill-appearing, toxic-appearing or diaphoretic.  HENT:     Head: Normocephalic and atraumatic.     Nose: Nose normal.     Mouth/Throat:     Mouth: Mucous membranes are moist.      Pharynx: Oropharynx is clear.  Eyes:     Pupils: Pupils are equal, round, and reactive to light.  Cardiovascular:     Rate and Rhythm: Normal rate.     Pulses: Normal pulses.     Heart sounds: Normal heart sounds.  Pulmonary:     Effort: Pulmonary effort is normal. No respiratory distress.     Breath sounds: Normal breath sounds.  Abdominal:     General: Bowel sounds are normal. There is no distension.  Genitourinary:    Labia:        Left: Tenderness and lesion present. No rash or injury.      Comments: Left labial fluctuance and induration between left labia and inguinal crease Musculoskeletal:        General: Normal range of motion.     Cervical back: Normal range of motion.  Skin:    General: Skin is warm and dry.     Capillary Refill: Capillary refill takes less than 2 seconds.     Comments: Fluctuance and  induration to left lateral labia.  Does not extend into Bartholin's area.  No lymphadenopathy.  No erythema or warmth.  Neurological:     Mental Status: She is alert.    ED Results / Procedures / Treatments   Labs (all labs ordered are listed, but only abnormal results are displayed) Labs Reviewed - No data to display  EKG None  Radiology No results found.  Procedures .06/01/2021Incision and Drainage  Date/Time: 06/07/2019 9:30 AM Performed by: 08/07/2019, PA-C Authorized by: Linwood Dibbles, PA-C   Consent:    Consent obtained:  Verbal   Consent given by:  Patient   Risks discussed:  Bleeding, incomplete drainage, pain and damage to other organs   Alternatives discussed:  No treatment Universal protocol:    Procedure explained and questions answered to patient or proxy's satisfaction: yes     Relevant documents present and verified: yes     Test results available and properly labeled: yes     Imaging studies available: yes     Required blood products, implants, devices, and special equipment available: yes     Site/side marked: yes     Immediately  prior to procedure a time out was called: yes     Patient identity confirmed:  Verbally with patient Location:    Type:  Abscess   Location:  Anogenital   Anogenital location: labial. Pre-procedure details:    Skin preparation:  Betadine Anesthesia (see MAR for exact dosages):    Anesthesia method:  Local infiltration   Local anesthetic:  Lidocaine 1% WITH epi  Procedure type:    Complexity:  Complex Procedure details:    Incision types:  Single straight   Incision depth:  Subcutaneous   Scalpel blade:  11   Wound management:  Probed and deloculated, irrigated with saline and extensive cleaning   Drainage:  Purulent   Drainage amount:  Copious   Wound treatment:  Wound left open   Packing materials:  None (Patient couldnt tolerate packing) Post-procedure details:    Patient tolerance of procedure:  Procedure terminated at patient's request   (including critical care time)  Medications Ordered in ED Medications  lidocaine (PF) (XYLOCAINE) 1 % injection (5 mLs  Given 06/07/19 0929)    ED Course  I have reviewed the triage vital signs and the nursing notes.  Pertinent labs & imaging results that were available during my care of the patient were reviewed by me and considered in my medical decision making (see chart for details).  57 old female presents for evaluation of labial abscess.  On exam she has fluctuance induration to her left labia.  Area not consistent with Bartholin's abscess or cyst.  No erythema or warmth.  Does not appear to extend deep into pelvis.  She denies any systemic symptoms.  Has history of similar.  Will attempt drainage.  Patient with labial abscess I&D here in ED.  Procedure initially tolerated well however patient could not tolerate packing. See procedure note. Wound left open.  Discussed warm sitz bath's, will start on antibiotics and have her follow-up outpatient in 2 days.  The patient has been appropriately medically screened and/or stabilized in the  ED. I have low suspicion for any other emergent medical condition which would require further screening, evaluation or treatment in the ED or require inpatient management.  Patient is hemodynamically stable and in no acute distress.  Patient able to ambulate in department prior to ED.  Evaluation does not show acute pathology that would require ongoing or additional emergent interventions while in the emergency department or further inpatient treatment.  I have discussed the diagnosis with the patient and answered all questions.  Pain is been managed while in the emergency department and patient has no further complaints prior to discharge.  Patient is comfortable with plan discussed in room and is stable for discharge at this time.  I have discussed strict return precautions for returning to the emergency department.  Patient was encouraged to follow-up with PCP/specialist refer to at discharge.   MDM Rules/Calculators/A&P                       Final Clinical Impression(s) / ED Diagnoses Final diagnoses:  Labial abscess    Rx / DC Orders ED Discharge Orders         Ordered    cephALEXin (KEFLEX) 500 MG capsule  2 times daily     06/07/19 0933           Strother Everitt A, PA-C 06/07/19 0934    Jacalyn Lefevre, MD 06/07/19 1413

## 2019-09-10 DIAGNOSIS — Z5181 Encounter for therapeutic drug level monitoring: Secondary | ICD-10-CM | POA: Diagnosis not present

## 2020-01-17 ENCOUNTER — Emergency Department (HOSPITAL_COMMUNITY)
Admission: EM | Admit: 2020-01-17 | Discharge: 2020-01-17 | Disposition: A | Discharge status: Home / Self Care | Payer: Medicaid Other | Attending: Emergency Medicine | Admitting: Emergency Medicine

## 2020-01-17 ENCOUNTER — Encounter (HOSPITAL_COMMUNITY): Payer: Self-pay | Admitting: *Deleted

## 2020-01-17 ENCOUNTER — Other Ambulatory Visit: Payer: Self-pay

## 2020-01-17 DIAGNOSIS — Z3A18 18 weeks gestation of pregnancy: Secondary | ICD-10-CM

## 2020-01-17 DIAGNOSIS — R1030 Lower abdominal pain, unspecified: Secondary | ICD-10-CM | POA: Diagnosis not present

## 2020-01-17 DIAGNOSIS — O26892 Other specified pregnancy related conditions, second trimester: Secondary | ICD-10-CM | POA: Diagnosis not present

## 2020-01-17 DIAGNOSIS — O2342 Unspecified infection of urinary tract in pregnancy, second trimester: Secondary | ICD-10-CM | POA: Diagnosis not present

## 2020-01-17 DIAGNOSIS — Z87891 Personal history of nicotine dependence: Secondary | ICD-10-CM | POA: Diagnosis not present

## 2020-01-17 DIAGNOSIS — O219 Vomiting of pregnancy, unspecified: Secondary | ICD-10-CM | POA: Insufficient documentation

## 2020-01-17 LAB — URINALYSIS, ROUTINE W REFLEX MICROSCOPIC
Bilirubin Urine: NEGATIVE
Glucose, UA: NEGATIVE mg/dL
Hgb urine dipstick: NEGATIVE
Ketones, ur: NEGATIVE mg/dL
Leukocytes,Ua: NEGATIVE
Nitrite: NEGATIVE
Protein, ur: NEGATIVE mg/dL
Specific Gravity, Urine: 1.018 (ref 1.005–1.030)
pH: 9 — ABNORMAL HIGH (ref 5.0–8.0)

## 2020-01-17 LAB — I-STAT BETA HCG BLOOD, ED (MC, WL, AP ONLY): I-stat hCG, quantitative: >2000 m[IU]/mL — ABNORMAL HIGH (ref <5)

## 2020-01-17 NOTE — Discharge Instructions (Addendum)
You your work-up today shows that you are pregnant.  It appears that you are 18 or 19 weeks judging by the height of your uterus.  [facility].  Please make sure you are taking prenatal vitamins.  Please keep the pregnancy verification letter in case she plan to apply for pregnancy Medicaid.

## 2020-01-17 NOTE — ED Triage Notes (Signed)
Patient states she has taken 2 preg test at home and they were both were neg however she states she has been vomiting off and on.

## 2020-01-17 NOTE — ED Provider Notes (Signed)
[facility] EMERGENCY DEPARTMENT Provider Note   CSN: [id number] Arrival date & time: 01/17/20  5364     History Chief Complaint  Patient presents with  . Abdominal Pain    Madison Soto is a 24 y.o. female who presents emergency department with chief complaint of pressure in her lower abdomen.  She states that she has had increased urinary frequency of late, she has also had some nausea and occasional vomiting.  She has taken 2 pregnancy tests at home both which were negative.  She has had some dysuria.  Denies flank pain or fevers.  Patient noticed some abdominal distention and felt like she has been gaining weight.  She has a history of irregular periods and is unsure of the date of her last menstrual cycle.  HPI     Past Medical History:  Diagnosis Date  . Medical history non-contributory     There are no problems to display for this patient.   Past Surgical History:  Procedure Laterality Date  . NO PAST SURGERIES       OB History    Gravida  1   Para  1   Term  1   Preterm      AB      Living  1     SAB      TAB      Ectopic      Multiple  0   Live Births  1           Family History  Problem Relation Age of Onset  . Hypertension Paternal Grandmother   . Hypertension Paternal Grandfather     Social History   Tobacco Use  . Smoking status: Former Games developer  . Smokeless tobacco: Never Used  Substance Use Topics  . Alcohol use: No  . Drug use: No    Home Medications Prior to Admission medications   Medication Sig Start Date End Date Taking? Authorizing Provider  ibuprofen (ADVIL,MOTRIN) 200 MG tablet Take 400-600 mg by mouth every 6 (six) hours as needed for headache (pain).    [provider]  ibuprofen (ADVIL,MOTRIN) 600 MG tablet Take 1 tablet (600 mg total) by mouth every 6 (six) hours. Patient not taking: Reported on 10/14/2017 08/03/16   Hurshel Party, CNM  Prenatal MV & Min w/FA-DHA (PRENATAL  ADULT GUMMY/DHA/FA PO) Take 1 tablet by mouth daily.    [provider]  triamcinolone ointment (KENALOG) 0.5 % Apply 1 application topically 2 (two) times daily. Patient not taking: Reported on 02/18/2018 10/14/17   Bethel Born, PA-C    Allergies    Patient has no known allergies.  Review of Systems   Review of Systems Ten systems reviewed and are negative for acute change, except as noted in the HPI.   Physical Exam Updated Vital Signs BP (!) 101/56 (BP Location: Left Arm)   Pulse 72   Temp 98.7 F (37.1 C) (Oral)   Resp 16   Ht 5\' 6"  (1.676 m)   Wt 68 kg   SpO2 99%   BMI 24.21 kg/m   Physical Exam Vitals and nursing note reviewed.  Constitutional:      General: She is not in acute distress.    Appearance: She is well-developed. She is not diaphoretic.  HENT:     Head: Normocephalic and atraumatic.  Eyes:     General: No scleral icterus.    Conjunctiva/sclera: Conjunctivae normal.  Cardiovascular:     Rate and Rhythm:  Normal rate and regular rhythm.     Heart sounds: Normal heart sounds. No murmur heard.  No friction rub. No gallop.   Pulmonary:     Effort: Pulmonary effort is normal. No respiratory distress.     Breath sounds: Normal breath sounds.  Abdominal:     General: Bowel sounds are normal. There is no distension.     Palpations: Abdomen is soft. There is no mass.     Tenderness: There is no abdominal tenderness. There is no guarding.     Comments: Gravid abdomen, Uterine fundus palpable to about 1-2 cm below the umbilicus  Musculoskeletal:     Cervical back: Normal range of motion.  Skin:    General: Skin is warm and dry.  Neurological:     Mental Status: She is alert and oriented to person, place, and time.  Psychiatric:        Behavior: Behavior normal.     ED Results / Procedures / Treatments   Labs (all labs ordered are listed, but only abnormal results are displayed) Labs Reviewed  I-STAT BETA HCG BLOOD, ED ([facility], [facility], [facility] ONLY)  - Abnormal; Notable for the following components:      Result Value   I-stat hCG, quantitative >2,000.0 (*)    All other components within normal limits    EKG None  Radiology No results found.  Procedures Ultrasound ED OB Pelvic  Date/Time: 01/17/2020 9:42 AM Performed by: Arthor Captain, PA-C Authorized by: Arthor Captain, PA-C   Procedure details:    Indications comment:  Urinary urgency, Pelvic distension r/o urinary retention vs, gravid uterus   Assess:  Intrauterine pregnancy   Images: not archived    Uterine findings:    Intrauterine pregnancy: identified     Single gestation: identified     Fetal heart rate: identified     Estimated gestational age: 2 weeks   (including critical care time)  Medications Ordered in ED Medications - No data to display  ED Course  I have reviewed the triage vital signs and the nursing notes.  Pertinent labs & imaging results that were available during my care of the patient were reviewed by me and considered in my medical decision making (see chart for details).  Clinical Course as of Jan 16 1302  Sat Jan 17, 2020  1012 Fetal heart tones 150-160   [AH]    Clinical Course User Index [AH] Arthor Captain, PA-C   MDM Rules/Calculators/A&P                          Patient here with urinary urgency.  Found to have a gravid abdomen.  Bedside ultrasound confirms that the patient has intrauterine gestation probably around 18 weeks.  Urine shows no evidence of infection.  I have discussed the case with the MAU provider who has [facility]. Final Clinical Impression(s) / ED Diagnoses Final diagnoses:  None    Rx / DC Orders ED Discharge Orders    None       Arthor Captain, PA-C 01/17/20 1304    Sabino Donovan, MD 01/18/20 [phone]

## 2020-01-19 ENCOUNTER — Telehealth: Payer: Self-pay | Admitting: *Deleted

## 2020-01-19 ENCOUNTER — Telehealth: Payer: Self-pay

## 2020-01-19 NOTE — Telephone Encounter (Signed)
-----   Message from Olevia Bowens sent at 01/19/2020  8:21 AM EST ----- Regarding: FW: New OB appointment  ----- Message ----- From: Rolm Bookbinder, CNM Sent: 01/17/2020  10:55 AM EST To: [facility] Subject: New OB appointment                             This patient was seen in the ED today and found to be approximately 18-20w pregnant. She has been seen at [facility] for gyn care in the past but wants to come to [facility] for prenatal care if she can. Can we call her to set up an appointment please?  Rayfield Citizen

## 2020-01-19 NOTE — Telephone Encounter (Signed)
Tried calling patient to schedule new OB appointment. Unable to leave voice message. Will send Mychart message.  Clovis Pu, RN

## 2020-01-19 NOTE — Telephone Encounter (Signed)
Transition Care Management Unsuccessful Follow-up Telephone Call  Date of discharge and from where:  01/17/2020 [facility]   Attempts:  1st Attempt  Reason for unsuccessful TCM follow-up call:  No answer/busy,no option to leave voicemail

## 2020-01-20 NOTE — Telephone Encounter (Signed)
Transition Care Management Unsuccessful Follow-up Telephone Call  Date of discharge and from where:  01/17/2020 [facility]   Attempts:  2nd Attempt  Reason for unsuccessful TCM follow-up call:  No answer/busy. No option to leave voicemail

## 2020-01-20 NOTE — Telephone Encounter (Signed)
Tried calling patient to schedule new OB appointment, unable to leave a voice message.  Clovis Pu, RN

## 2020-01-23 NOTE — Telephone Encounter (Signed)
Transition Care Management Unsuccessful Follow-up Telephone Call  Date of discharge and from where:  01/17/2020 [facility]  Attempts:  3rd Attempt  Reason for unsuccessful TCM follow-up call:  No answer/busy. No option to leave voicemail

## 2020-01-27 ENCOUNTER — Encounter (HOSPITAL_COMMUNITY): Payer: Self-pay | Admitting: Emergency Medicine

## 2020-01-27 ENCOUNTER — Other Ambulatory Visit: Payer: Self-pay

## 2020-01-27 ENCOUNTER — Ambulatory Visit (HOSPITAL_COMMUNITY)
Admission: EM | Admit: 2020-01-27 | Discharge: 2020-01-27 | Disposition: A | Discharge status: Home / Self Care | Payer: Medicaid Other | Attending: Family Medicine | Admitting: Family Medicine

## 2020-01-27 DIAGNOSIS — N764 Abscess of vulva: Secondary | ICD-10-CM

## 2020-01-27 MED ORDER — HIBICLENS 4 % EX LIQD: Freq: Every day | CUTANEOUS | 0 refills | Status: DC | PRN

## 2020-01-27 MED ORDER — ACETAMINOPHEN 325 MG PO TABS
650.0000 mg | ORAL_TABLET | Freq: Once | ORAL | Status: AC
  Administered 2020-01-27: 650 mg via ORAL

## 2020-01-27 MED ORDER — ACETAMINOPHEN 325 MG PO TABS
ORAL_TABLET | ORAL | Status: AC
  Filled 2020-01-27: qty 2

## 2020-01-27 MED ORDER — AMOXICILLIN 875 MG PO TABS: 875.0000 mg | ORAL_TABLET | Freq: Two times a day (BID) | ORAL | 0 refills | Status: DC

## 2020-01-27 NOTE — ED Triage Notes (Signed)
Abscess to left groin area, patient reports pain, altered gait due to pain.  Onset of symptoms 2 days ago.  Patient has a history of the same

## 2020-01-30 NOTE — ED Provider Notes (Signed)
[facility]    CSN: [id number] Arrival date & time: 01/27/20  1914      History   Chief Complaint Chief Complaint  Patient presents with  . Abscess    HPI Madison Soto is a 24 y.o. female.   Here today with 2 day hx of progressively worsening left labial abscess. Quickly becoming larger, more painful, and red per patient. Has had this issue once before and had to have the area drained. Barely able to sit or walk due to the pain at this time. Denies drainage, fever, chills, sweats. Has been using warm compresses without benefit.      Past Medical History:  Diagnosis Date  . Medical history non-contributory     There are no problems to display for this patient.   Past Surgical History:  Procedure Laterality Date  . NO PAST SURGERIES      OB History    Gravida  2   Para  1   Term  1   Preterm      AB      Living  1     SAB      TAB      Ectopic      Multiple  0   Live Births  1            Home Medications    Prior to Admission medications   Medication Sig Start Date End Date Taking? Authorizing Provider  amoxicillin (AMOXIL) 875 MG tablet Take 1 tablet (875 mg total) by mouth 2 (two) times daily. 01/27/20   Particia Nearing, PA-C  chlorhexidine (HIBICLENS) 4 % external liquid Apply topically daily as needed. 01/27/20   Particia Nearing, PA-C  ibuprofen (ADVIL,MOTRIN) 600 MG tablet Take 1 tablet (600 mg total) by mouth every 6 (six) hours. Patient not taking: Reported on 10/14/2017 08/03/16   Sharen Counter A, CNM  triamcinolone ointment (KENALOG) 0.5 % Apply 1 application topically 2 (two) times daily. Patient not taking: Reported on 02/18/2018 10/14/17   Bethel Born, PA-C    Family History Family History  Problem Relation Age of Onset  . Hypertension Paternal Grandmother   . Hypertension Paternal Grandfather     Social History Social History   Tobacco Use  . Smoking status: Former Games developer  .  Smokeless tobacco: Never Used  Substance Use Topics  . Alcohol use: No  . Drug use: No     Allergies   Patient has no known allergies.   Review of Systems Review of Systems PER HPI    Physical Exam Triage Vital Signs ED Triage Vitals  Enc Vitals Group     BP 01/27/20 2004 (!) 107/59     Pulse Rate 01/27/20 2004 81     Resp 01/27/20 2004 18     Temp 01/27/20 2004 99.2 F (37.3 C)     Temp Source 01/27/20 2004 Oral     SpO2 01/27/20 2004 99 %     Weight --      Height --      Head Circumference --      Peak Flow --      Pain Score 01/27/20 2001 10     Pain Loc --      Pain Edu? --      Excl. in GC? --    No data found.  Updated Vital Signs BP (!) 107/59 (BP Location: Left Arm)   Pulse 81   Temp 99.2 F (37.3 C) (Oral)  Resp 18   LMP 05/29/2019   SpO2 99%   Visual Acuity Right Eye Distance:   Left Eye Distance:   Bilateral Distance:    Right Eye Near:   Left Eye Near:    Bilateral Near:     Physical Exam Vitals and nursing note reviewed.  Constitutional:      Appearance: Normal appearance. She is not ill-appearing.  HENT:     Head: Atraumatic.  Eyes:     Extraocular Movements: Extraocular movements intact.     Conjunctiva/sclera: Conjunctivae normal.  Cardiovascular:     Rate and Rhythm: Normal rate and regular rhythm.     Heart sounds: Normal heart sounds.  Pulmonary:     Effort: Pulmonary effort is normal.     Breath sounds: Normal breath sounds.  Musculoskeletal:        General: Normal range of motion.     Cervical back: Normal range of motion and neck supple.  Skin:    General: Skin is warm and dry.     Comments: 1 cm x 3 cm firm labial abscess present, significantly ttp. No active drainage  Neurological:     Mental Status: She is alert and oriented to person, place, and time.  Psychiatric:        Mood and Affect: Mood normal.        Thought Content: Thought content normal.        Judgment: Judgment normal.      UC Treatments /  Results  Labs (all labs ordered are listed, but only abnormal results are displayed) Labs Reviewed - No data to display  EKG   Radiology No results found.  Procedures Incision and Drainage  Date/Time: 01/27/2020 8:00 PM Performed by: Particia Nearing, PA-C Authorized by: Particia Nearing, PA-C   Consent:    Consent obtained:  Verbal   Consent given by:  Patient   Risks discussed:  Bleeding, incomplete drainage and infection Location:    Type:  Abscess   Location: left labia. Pre-procedure details:    Skin preparation:  Chloraprep Anesthesia (see MAR for exact dosages):    Anesthesia method:  Local infiltration   Local anesthetic:  Lidocaine 2% w/o epi Procedure type:    Complexity:  Simple Procedure details:    Needle aspiration: no     Incision types:  Stab incision   Incision depth:  Dermal   Scalpel blade:  11   Wound management:  Probed and deloculated   Drainage:  Purulent   Drainage amount:  Moderate   Wound treatment:  Wound left open   Packing materials:  None Post-procedure details:    Patient tolerance of procedure:  Tolerated well, no immediate complications   (including critical care time)  Medications Ordered in UC Medications  acetaminophen (TYLENOL) tablet 650 mg (650 mg Oral Given 01/27/20 2028)    Initial Impression / Assessment and Plan / UC Course  I have reviewed the triage vital signs and the nursing notes.  Pertinent labs & imaging results that were available during my care of the patient were reviewed by me and considered in my medical decision making (see chart for details).     Abscess drained successfully without complication, discussed home wound care with hibiclens, neosporin, keeping area covered, using warm compresses off and on. Will tx with amoxil and have her f/u with GYN for recheck in 1 week. Recently found out she was 2nd trimester pregnant and will be following closely with GYN for this already.  Final  Clinical Impressions(s) / UC Diagnoses   Final diagnoses:  Left genital labial abscess   Discharge Instructions   None    ED Prescriptions    Medication Sig Dispense Auth. Provider   amoxicillin (AMOXIL) 875 MG tablet Take 1 tablet (875 mg total) by mouth 2 (two) times daily. 14 tablet Particia Nearing, [place]   chlorhexidine (HIBICLENS) 4 % external liquid Apply topically daily as needed. 120 mL Particia Nearing, [place]     PDMP not reviewed this encounter.   Roosvelt Maser [place], [place] 01/30/20 [phone]

## 2020-02-28 NOTE — L&D Delivery Note (Addendum)
OB Delivery Note Madison Soto is a 25 y.o. s/p vaginal delivery at [redacted]w[redacted]d.  She was admitted for IOL due to post-dates and non-reactive NST in the office.   ROM: 2h 1m, AROM with clear fluid GBS Status: Negative Maximum Maternal Temperature: 98.56F   Labor Progress: Patient's cervix was favorable on admission. She was started on pitocin and continued to progress well. AROM was performed for clear fluid. She was then noted to have complete cervical dilation and delivered without complication as noted below.   Delivery Date/Time: 05/20/2020 at 1150 Delivery: Called to room and patient was complete and pushing. Head delivered LOA with compound right hand. No nuchal cord present. Shoulder and body delivered in usual fashion. Infant with spontaneous cry, placed on mother's abdomen, dried and stimulated. Cord clamped x 2 after 1-minute delay, and cut by FOB under my direct supervision. Cord blood drawn. Placenta delivered spontaneously with gentle cord traction. Fundus firm with massage and Pitocin. Labia, perineum, vagina, and cervix were inspected; small bilateral periurethral lacerations noted, but were hemostatic and did not require repair.   Placenta: intact, 3-vessel cord, sent to L&D Complications: none Lacerations: hemostatic bilateral periurethral lacerations w/o need for repair EBL: 75 ml Analgesia: none   Infant: viable female  APGARs 7 & 9  weight per medical record  Maury Dus, MD  I was present and gloved for delivery of infant and placenta. I agree with resident's documentation as noted above.  Sheila Oats, MD OB Fellow, [facility] 05/20/2020 1:00 PM  Attestation of Attending Supervision of Obstetric Fellow: Evaluation and management procedures were performed by the Obstetric Fellow under my supervision and collaboration.  I have reviewed the Obstetric Fellow's note and chart, and I agree with the management and plan. I have also made any necessary editorial  changes.   Sharon Seller, DO Attending Obstetrician & Gynecologist, [facility] for [facility], [facility] 05/20/2020 1:54 PM

## 2020-03-04 ENCOUNTER — Ambulatory Visit (INDEPENDENT_AMBULATORY_CARE_PROVIDER_SITE_OTHER): Payer: Medicaid Other | Admitting: Obstetrics and Gynecology

## 2020-03-04 ENCOUNTER — Other Ambulatory Visit (HOSPITAL_COMMUNITY)
Admission: RE | Admit: 2020-03-04 | Discharge: 2020-03-04 | Disposition: A | Discharge status: Home / Self Care | Payer: Medicaid Other | Source: Ambulatory Visit | Attending: Obstetrics and Gynecology | Admitting: Obstetrics and Gynecology

## 2020-03-04 ENCOUNTER — Encounter: Payer: Self-pay | Admitting: Obstetrics and Gynecology

## 2020-03-04 ENCOUNTER — Other Ambulatory Visit: Payer: Self-pay

## 2020-03-04 VITALS — BP 108/61 | HR 94 | Temp 98.2°F | Wt 156.2 lb

## 2020-03-04 DIAGNOSIS — Z3480 Encounter for supervision of other normal pregnancy, unspecified trimester: Secondary | ICD-10-CM | POA: Insufficient documentation

## 2020-03-04 DIAGNOSIS — M5431 Sciatica, right side: Secondary | ICD-10-CM

## 2020-03-04 DIAGNOSIS — O0933 Supervision of pregnancy with insufficient antenatal care, third trimester: Secondary | ICD-10-CM

## 2020-03-04 DIAGNOSIS — Z3482 Encounter for supervision of other normal pregnancy, second trimester: Secondary | ICD-10-CM | POA: Diagnosis not present

## 2020-03-04 DIAGNOSIS — Z3A30 30 weeks gestation of pregnancy: Secondary | ICD-10-CM

## 2020-03-04 MED ORDER — PRENATE MINI 18-0.6-0.4-350 MG PO CAPS: 1.0000 | ORAL_CAPSULE | Freq: Every day | ORAL | 12 refills | Status: DC

## 2020-03-04 MED ORDER — PRENATE MINI 18-0.6-0.4-350 MG PO CAPS: 1.0000 | ORAL_CAPSULE | Freq: Every day | ORAL | 0 refills | Status: DC

## 2020-03-04 MED ORDER — COMFORT FIT MATERNITY SUPP SM MISC: 1.0000 [IU] | Freq: Every day | 0 refills | Status: DC | PRN

## 2020-03-04 NOTE — Patient Instructions (Addendum)
Sciatica  Sciatica is pain, numbness, weakness, or tingling along the path of the sciatic nerve. The sciatic nerve starts in the lower back and runs down the back of each leg. The nerve controls the muscles in the lower leg and in the back of the knee. It also provides feeling (sensation) to the back of the thigh, the lower leg, and the sole of the foot. Sciatica is a symptom of another medical condition that pinches or puts pressure on the sciatic nerve. Sciatica most often only affects one side of the body. Sciatica usually goes away on its own or with treatment. In some cases, sciatica may come back (recur). What are the causes? This condition is caused by pressure on the sciatic nerve or pinching of the nerve. This may be the result of:  A disk in between the bones of the spine bulging out too far (herniated disk).  Age-related changes in the spinal disks.  A pain disorder that affects a muscle in the buttock.  Extra bone growth near the sciatic nerve.  A break (fracture) of the pelvis.  Pregnancy.  Tumor. This is rare. What increases the risk? The following factors may make you more likely to develop this condition:  Playing sports that place pressure or stress on the spine.  Having poor strength and flexibility.  A history of back injury or surgery.  Sitting for long periods of time.  Doing activities that involve repetitive bending or lifting.  Obesity. What are the signs or symptoms? Symptoms can vary from mild to very severe, and they may include:  Any of these problems in the lower back, leg, hip, or buttock: ? Mild tingling, numbness, or dull aches. ? Burning sensations. ? Sharp pains.  Numbness in the back of the calf or the sole of the foot.  Leg weakness.  Severe back pain that makes movement difficult. Symptoms may get worse when you cough, sneeze, or laugh, or when you sit or stand for long periods of time. How is this diagnosed? This condition may be  diagnosed based on:  Your symptoms and medical history.  A physical exam.  Blood tests.  Imaging tests, such as: ? X-rays. ? MRI. ? CT scan. How is this treated? In many cases, this condition improves on its own without treatment. However, treatment may include:  Reducing or modifying physical activity.  Exercising and stretching.  Icing and applying heat to the affected area.  Medicines that help to: ? Relieve pain and swelling. ? Relax your muscles.  Injections of medicines that help to relieve pain, irritation, and inflammation around the sciatic nerve (steroids).  Surgery. Follow these instructions at home: Medicines  Take over-the-counter and prescription medicines only as told by your health care provider.  Ask your health care provider if the medicine prescribed to you: ? Requires you to avoid driving or using heavy machinery. ? Can cause constipation. You may need to take these actions to prevent or treat constipation:  Drink enough fluid to keep your urine pale yellow.  Take over-the-counter or prescription medicines.  Eat foods that are high in fiber, such as beans, whole grains, and fresh fruits and vegetables.  Limit foods that are high in fat and processed sugars, such as fried or sweet foods. Managing pain      If directed, put ice on the affected area. ? Put ice in a plastic bag. ? Place a towel between your skin and the bag. ? Leave the ice on for 20 minutes,   2-3 times a day.  If directed, apply heat to the affected area. Use the heat source that your health care provider recommends, such as a moist heat pack or a heating pad. ? Place a towel between your skin and the heat source. ? Leave the heat on for 20-30 minutes. ? Remove the heat if your skin turns bright red. This is especially important if you are unable to feel pain, heat, or cold. You may have a greater risk of getting burned. Activity   Return to your normal activities as told  by your health care provider. Ask your health care provider what activities are safe for you.  Avoid activities that make your symptoms worse.  Take brief periods of rest throughout the day. ? When you rest for longer periods, mix in some mild activity or stretching between periods of rest. This will help to prevent stiffness and pain. ? Avoid sitting for long periods of time without moving. Get up and move around at least one time each hour.  Exercise and stretch regularly, as told by your health care provider.  Do not lift anything that is heavier than 10 lb (4.5 kg) while you have symptoms of sciatica. When you do not have symptoms, you should still avoid heavy lifting, especially repetitive heavy lifting.  When you lift objects, always use proper lifting technique, which includes: ? Bending your knees. ? Keeping the load close to your body. ? Avoiding twisting. General instructions  Maintain a healthy weight. Excess weight puts extra stress on your back.  Wear supportive, comfortable shoes. Avoid wearing high heels.  Avoid sleeping on a mattress that is too soft or too hard. A mattress that is firm enough to support your back when you sleep may help to reduce your pain.  Keep all follow-up visits as told by your health care provider. This is important. Contact a health care provider if:  You have pain that: ? Wakes you up when you are sleeping. ? Gets worse when you lie down. ? Is worse than you have experienced in the past. ? Lasts longer than 4 weeks.  You have an unexplained weight loss. Get help right away if:  You are not able to control when you urinate or have bowel movements (incontinence).  You have: ? Weakness in your lower back, pelvis, buttocks, or legs that gets worse. ? Redness or swelling of your back. ? A burning sensation when you urinate. Summary  Sciatica is pain, numbness, weakness, or tingling along the path of the sciatic nerve.  This condition  is caused by pressure on the sciatic nerve or pinching of the nerve.  Sciatica can cause pain, numbness, or tingling in the lower back, legs, hips, and buttocks.  Treatment often includes rest, exercise, medicines, and applying ice or heat. This information is not intended to replace advice given to you by your health care provider. Make sure you discuss any questions you have with your health care provider. Document Revised: 03/04/2018 Document Reviewed: 03/04/2018 Elsevier Patient Education  2020 Else[employer] Inc.     PREGNANCY SUPPORT BELT: You are not alone, Seventy-five percent of women have some sort of abdominal or back pain at some point in their pregnancy. Your baby is growing at a fast pace, which means that your whole body is rapidly trying to adjust to the changes. As your uterus grows, your back may start feeling a bit under stress and this can result in back or abdominal pain that can go from  mild, and therefore bearable, to severe pains that will not allow you to sit or lay down comfortably, When it comes to dealing with pregnancy-related pains and cramps, some pregnant women usually prefer natural remedies, which the market is filled with nowadays. For example, wearing a pregnancy support belt can help ease and lessen your discomfort and pain.  WHAT ARE THE BENEFITS OF WEARING A PREGNANCY SUPPORT BELT? A pregnancy support belt provides support to the lower portion of the belly taking some of the weight of the growing uterus and distributing to the other parts of your body. It is designed make you comfortable and gives you extra support. Over the years, the pregnancy apparel market has been studying the needs and wants of pregnant women and they have come up with the most comfortable pregnancy support belts that woman could ever ask for. In fact, you will no longer have to wear a stretched-out or bulky pregnancy belt that is visible underneath your clothes and makes you feel even more  uncomfortable. Nowadays, a pregnancy support belt is made of comfortable and stretchy materials that will not irritate your skin but will actually make you feel at ease and you will not even notice you are wearing it. They are easy to put on and adjust during the day and can be worn at night for additional support.  BENEFITS: . Relives Back pain . Relieves Abdominal Muscle and Leg Pain . Stabilizes the Pelvic Ring . Offers a Cushioned Abdominal Lift Pad . Relieves pressure on the Sciatic Nerve Within Minutes WHERE TO GET YOUR PREGNANCY BELT:   [facility] [address] [place], [place] [address] [phone]  [facility] [address], [address] [place], [place] [address] [phone]   Glucose Tolerance Test During Pregnancy Why am I having this test? The glucose tolerance test (GTT) is done to check how your body processes sugar (glucose). This is one of several tests used to diagnose diabetes that develops during pregnancy (gestational diabetes mellitus). Gestational diabetes is a temporary form of diabetes that some women develop during pregnancy. It usually occurs during the second trimester of pregnancy and goes away after delivery. Testing (screening) for gestational diabetes usually occurs between 24 and 28 weeks of pregnancy. You may have the GTT test after having a 1-hour glucose screening test if the results from that test indicate that you may have gestational diabetes. You may also have this test if:  You have a history of gestational diabetes.  You have a history of giving birth to very large babies or have experienced repeated fetal loss (stillbirth).  You have signs and symptoms of diabetes, such as: ? Changes in your vision. ? Tingling or numbness in your hands or feet. ? Changes in hunger, thirst, and urination that are not otherwise explained by your pregnancy. What is being tested? This test measures the amount of glucose  in your blood at different times during a period of 3 hours. This indicates how well your body is able to process glucose. What kind of sample is taken?  Blood samples are required for this test. They are usually collected by inserting a needle into a blood vessel. How do I prepare for this test?  For 3 days before your test, eat normally. Have plenty of carbohydrate-rich foods.  Follow instructions from your health care provider about: ? Eating or drinking restrictions on the day of the test. You may be asked to not eat or drink anything other than water (  fast) starting 8-10 hours before the test. ? Changing or stopping your regular medicines. Some medicines may interfere with this test. Tell a health care provider about:  All medicines you are taking, including vitamins, herbs, eye drops, creams, and over-the-counter medicines.  Any blood disorders you have.  Any surgeries you have had.  Any medical conditions you have. What happens during the test? First, your blood glucose will be measured. This is referred to as your fasting blood glucose, since you fasted before the test. Then, you will drink a glucose solution that contains a certain amount of glucose. Your blood glucose will be measured again 1, 2, and 3 hours after drinking the solution. This test takes about 3 hours to complete. You will need to stay at the testing location during this time. During the testing period:  Do not eat or drink anything other than the glucose solution.  Do not exercise.  Do not use any products that contain nicotine or tobacco, such as cigarettes and e-cigarettes. If you need help stopping, ask your health care provider. The testing procedure may vary among health care providers and hospitals. How are the results reported? Your results will be reported as milligrams of glucose per deciliter of blood (mg/dL) or millimoles per liter (mmol/L). Your health care provider will compare your results to  normal ranges that were established after testing a large group of people (reference ranges). Reference ranges may vary among labs and hospitals. For this test, common reference ranges are:  Fasting: less than 95-105 mg/dL (5.3-5.8 mmol/L).  1 hour after drinking glucose: less than 180-190 mg/dL (10.0-10.5 mmol/L).  2 hours after drinking glucose: less than 155-165 mg/dL (8.6-9.2 mmol/L).  3 hours after drinking glucose: 140-145 mg/dL (7.8-8.1 mmol/L). What do the results mean? Results within reference ranges are considered normal, meaning that your glucose levels are well-controlled. If two or more of your blood glucose levels are high, you may be diagnosed with gestational diabetes. If only one level is high, your health care provider may suggest repeat testing or other tests to confirm a diagnosis. Talk with your health care provider about what your results mean. Questions to ask your health care provider Ask your health care provider, or the department that is doing the test:  When will my results be ready?  How will I get my results?  What are my treatment options?  What other tests do I need?  What are my next steps? Summary  The glucose tolerance test (GTT) is one of several tests used to diagnose diabetes that develops during pregnancy (gestational diabetes mellitus). Gestational diabetes is a temporary form of diabetes that some women develop during pregnancy.  You may have the GTT test after having a 1-hour glucose screening test if the results from that test indicate that you may have gestational diabetes. You may also have this test if you have any symptoms or risk factors for gestational diabetes.  Talk with your health care provider about what your results mean. This information is not intended to replace advice given to you by your health care provider. Make sure you discuss any questions you have with your health care provider. Document Revised: 06/06/2018 Document  Reviewed: 09/25/2016 Elsevier Patient Education  [place].

## 2020-03-04 NOTE — Progress Notes (Signed)
INITIAL OBSTETRICAL VISIT Patient name: Madison Soto MRN [id number]  Date of birth: 1995-07-22 Chief Complaint:   Initial Prenatal Visit  History of Present Illness:   Madison Soto is a 25 y.o. G35P1001 African American female at [redacted]w[redacted]d by unsure LMP with an Estimated Date of Delivery: 05/13/20 being seen today for her initial obstetrical visit.  Her obstetrical history is significant for late to prenatal care. This is an unplanned pregnancy. She and the father of the baby (FOB) "Wideman" live together. She has a support system that consists of the FOB/her family/friends. Today she reports sciatic nerve pain down RT leg.   Patient's last menstrual period was 08/07/2019 (within days). Last pap unknown. Results were: unknown Review of Systems:   Pertinent items are noted in HPI Denies cramping/contractions, leakage of fluid, vaginal bleeding, abnormal vaginal discharge w/ itching/odor/irritation, headaches, visual changes, shortness of breath, chest pain, abdominal pain, severe nausea/vomiting, or problems with urination or bowel movements unless otherwise stated above.  Pertinent History Reviewed:  Reviewed past medical,surgical, social, obstetrical and family history.  Reviewed problem list, medications and allergies. OB History  Gravida Para Term Preterm AB Living  2 1 1     1   SAB IAB Ectopic Multiple Live Births        0 1    # Outcome Date GA Lbr Len/2nd Weight Sex Delivery Anes PTL Lv  2 Current           1 Term 08/02/16 [redacted]w[redacted]d 06:55 / 00:23 7 lb 6.5 oz (3.359 kg) M Vag-Spont EPI  LIV   Physical Assessment:   Vitals:   03/04/20 1409  BP: 108/61  Pulse: 94  Temp: 98.2 F (36.8 C)  Weight: 156 lb 3.2 oz (70.9 kg)  Body mass index is 25.21 kg/m.       Physical Examination:  General appearance - well appearing, and in no distress  Mental status - alert, oriented to person, place, and time  Psych:  She has a normal mood and affect  Skin - warm and dry, normal color, no  suspicious lesions noted  Chest - effort normal, all lung fields clear to auscultation bilaterally  Heart - normal rate and regular rhythm  Abdomen - soft, nontender  Extremities:  No swelling or varicosities noted  Pelvic - VULVA: normal appearing vulva with no masses, tenderness or lesions  VAGINA: normal appearing vagina with normal color and discharge, no lesions.   CERVIX: normal appearing cervix without discharge or lesions, no CMT  Thin prep pap is done with reflex HR HPV cotesting   FHTs by doppler: 144 bpm  Assessment & Plan:  1) Low-Risk Pregnancy G2P1001 at [redacted]w[redacted]d with an Estimated Date of Delivery: 05/13/20   2) Initial OB visit - Welcomed to practice and introduced self to patient in addition to discussing other advanced practice providers that she may be seeing at this practice - Congratulated patient - Anticipatory guidance on upcoming appointments - Educated on COVID-19 and pregnancy and the integration of virtual appointments  - Educated on babyscripts app- patient reports she has not received email, encouraged to look in spam folder and to call office if she still has not received email - patient verbalizes understanding    3) Supervision of other normal pregnancy, antepartum - Cytology - PAP( [place]) - Cervicovaginal ancillary only( [place]) - Culture, OB Urine - Hemoglobin A1c - Genetic Screening   4) Late prenatal care affecting pregnancy in third trimester  5) [redacted] weeks gestation of pregnancy  6) Sciatica of right side  - Rx for Elastic Bandages & Supports (COMFORT FIT MATERNITY SUPP SM) MISC - Information provided on places to pick up maternity belt     Meds:  Meds ordered this encounter  Medications  . Prenat-FeCbn-FeAsp-Meth-FA-DHA (PRENATE MINI) 18-0.6-0.4-350 MG CAPS    Sig: Take 1 capsule by mouth daily.    Dispense:  30 capsule    Refill:  12  . Prenat-FeCbn-FeAsp-Meth-FA-DHA (PRENATE MINI) 18-0.6-0.4-350 MG CAPS    Sig: Take 1 capsule  by mouth daily.    Dispense:  30 capsule    Refill:  0    Order Specific Question:   Lot Number?    Answer:   [id number]    Order Specific Question:   Expiration Date?    Answer:   10/27/2020    Order Specific Question:   Quantity    Answer:   9    Comments:   3 capsules/box    Initial labs obtained Continue prenatal vitamins Reviewed n/v relief measures and warning s/s to report Reviewed recommended weight gain based on pre-gravid BMI Encouraged well-balanced diet Genetic Screening discussed: ordered Cystic fibrosis, SMA, Fragile X screening discussed ordered The nature of [facility] with multiple MDs and other Advanced Practice Providers was explained to patient; also emphasized that residents, students are part of our team.  Discussed optimized OB schedule and video visits. Advised can have an in-office visit whenever she feels she needs to be seen.  Does not have own BP cuff. BP cuff Rx faxed today. Explained to patient that BP will be mailed to her house. Check BP weekly, let us know if >140/90. Advised to call during normal business hours and there is an after-hours nurse line available.    Follow-up: Return in about 2 weeks (around 03/18/2020) for Return OB visit.   Orders Placed This Encounter  Procedures  . Culture, OB Urine  . [place] MFM OB COMP + 14 WK  . Hemoglobin A1c  . Genetic Screening  . CBC/D/Plt+RPR+Rh+ABO+Rub Ab...    Raelyn Mora MSN, CNM 03/04/2020

## 2020-03-05 LAB — CBC/D/PLT+RPR+RH+ABO+RUB AB...
Antibody Screen: NEGATIVE
Basophils Absolute: 0 10*3/uL (ref 0.0–0.2)
Basos: 0 %
EOS (ABSOLUTE): 0.2 10*3/uL (ref 0.0–0.4)
Eos: 2 %
HCV Ab: <0.1 s/co ratio (ref 0.0–0.9)
HIV Screen 4th Generation wRfx: NONREACTIVE
Hematocrit: 31.4 % — ABNORMAL LOW (ref 34.0–46.6)
Hemoglobin: 10.6 g/dL — ABNORMAL LOW (ref 11.1–15.9)
Hepatitis B Surface Ag: NEGATIVE
Immature Grans (Abs): 0 10*3/uL (ref 0.0–0.1)
Immature Granulocytes: 1 %
Lymphocytes Absolute: 2.2 10*3/uL (ref 0.7–3.1)
Lymphs: 27 %
MCH: 29.8 pg (ref 26.6–33.0)
MCHC: 33.8 g/dL (ref 31.5–35.7)
MCV: 88 fL (ref 79–97)
Monocytes Absolute: 0.6 10*3/uL (ref 0.1–0.9)
Monocytes: 8 %
Neutrophils Absolute: 5.1 10*3/uL (ref 1.4–7.0)
Neutrophils: 62 %
Platelets: 307 10*3/uL (ref 150–450)
RBC: 3.56 x10E6/uL — ABNORMAL LOW (ref 3.77–5.28)
RDW: 13 % (ref 11.7–15.4)
RPR Ser Ql: NONREACTIVE
Rh Factor: POSITIVE
Rubella Antibodies, IGG: 1.99 index (ref >0.99)
WBC: 8.3 10*3/uL (ref 3.4–10.8)

## 2020-03-05 LAB — HEMOGLOBIN A1C
Est. average glucose Bld gHb Est-mCnc: 103 mg/dL
Hgb A1c MFr Bld: 5.2 % (ref 4.8–5.6)

## 2020-03-05 LAB — HCV INTERPRETATION

## 2020-03-06 LAB — URINE CULTURE, OB REFLEX

## 2020-03-06 LAB — CULTURE, OB URINE

## 2020-03-08 ENCOUNTER — Other Ambulatory Visit: Payer: Self-pay

## 2020-03-08 ENCOUNTER — Ambulatory Visit (INDEPENDENT_AMBULATORY_CARE_PROVIDER_SITE_OTHER): Payer: Medicaid Other | Admitting: *Deleted

## 2020-03-08 DIAGNOSIS — Z3480 Encounter for supervision of other normal pregnancy, unspecified trimester: Secondary | ICD-10-CM | POA: Diagnosis not present

## 2020-03-08 LAB — CERVICOVAGINAL ANCILLARY ONLY
Bacterial Vaginitis (gardnerella): NEGATIVE
Candida Glabrata: NEGATIVE
Candida Vaginitis: NEGATIVE
Chlamydia: NEGATIVE
Comment: NEGATIVE
Comment: NEGATIVE
Comment: NEGATIVE
Comment: NEGATIVE
Comment: NEGATIVE
Comment: NORMAL
Neisseria Gonorrhea: NEGATIVE
Trichomonas: POSITIVE — AB

## 2020-03-08 NOTE — Progress Notes (Signed)
   Patient in clinic to complete 2 ht gtt.  Clovis Pu, RN

## 2020-03-09 ENCOUNTER — Ambulatory Visit: Payer: Medicaid Other

## 2020-03-09 ENCOUNTER — Telehealth: Payer: Self-pay | Admitting: *Deleted

## 2020-03-09 DIAGNOSIS — A599 Trichomoniasis, unspecified: Secondary | ICD-10-CM

## 2020-03-09 DIAGNOSIS — Z0000 Encounter for general adult medical examination without abnormal findings: Secondary | ICD-10-CM

## 2020-03-09 LAB — GLUCOSE TOLERANCE, 2 HOURS W/ 1HR
Glucose, 1 hour: 123 mg/dL (ref 65–179)
Glucose, 2 hour: 99 mg/dL (ref 65–152)
Glucose, Fasting: 72 mg/dL (ref 65–91)

## 2020-03-09 LAB — CYTOLOGY - PAP: Diagnosis: NEGATIVE

## 2020-03-09 MED ORDER — METRONIDAZOLE 500 MG PO TABS: 500.0000 mg | ORAL_TABLET | Freq: Two times a day (BID) | ORAL | 0 refills | Status: DC

## 2020-03-09 NOTE — Telephone Encounter (Signed)
-----   Message from Raelyn Mora, [place] sent at 03/09/2020 10:16 AM EST ----- Please notify and treat for trichomonas

## 2020-03-11 ENCOUNTER — Encounter: Payer: Self-pay | Admitting: *Deleted

## 2020-03-12 ENCOUNTER — Telehealth: Payer: Self-pay

## 2020-03-12 NOTE — Telephone Encounter (Signed)
-----   Message from Raelyn Mora, [place] sent at 03/06/2020  1:17 PM EST ----- Regarding: Ultrasound Please make sure this patient is scheduled ASAP for anatomy U/S. I could not find where that had been scheduled.  Thanks, Raelyn Mora, CNM

## 2020-03-12 NOTE — Telephone Encounter (Signed)
Called patient, no answer, unable to leave message. Ultrasound Appt scheduled for 01/25 @0745  (first available). My chart message sent to notify patient.

## 2020-03-18 ENCOUNTER — Encounter: Payer: Self-pay | Admitting: Obstetrics and Gynecology

## 2020-03-18 ENCOUNTER — Ambulatory Visit (INDEPENDENT_AMBULATORY_CARE_PROVIDER_SITE_OTHER): Payer: Medicaid Other | Admitting: Obstetrics and Gynecology

## 2020-03-18 ENCOUNTER — Other Ambulatory Visit: Payer: Self-pay

## 2020-03-18 VITALS — BP 104/61 | HR 78 | Temp 98.2°F | Wt 157.0 lb

## 2020-03-18 DIAGNOSIS — O23593 Infection of other part of genital tract in pregnancy, third trimester: Secondary | ICD-10-CM

## 2020-03-18 DIAGNOSIS — Z3480 Encounter for supervision of other normal pregnancy, unspecified trimester: Secondary | ICD-10-CM

## 2020-03-18 DIAGNOSIS — A5901 Trichomonal vulvovaginitis: Secondary | ICD-10-CM | POA: Insufficient documentation

## 2020-03-18 MED ORDER — BLOOD PRESSURE MONITOR AUTOMAT DEVI: 1.0000 | Freq: Every day | 0 refills | Status: DC

## 2020-03-18 MED ORDER — GOJJI WEIGHT SCALE MISC: 1.0000 | Freq: Every day | 0 refills | Status: DC | PRN

## 2020-03-18 MED ORDER — METRONIDAZOLE 500 MG PO TABS: 500.0000 mg | ORAL_TABLET | Freq: Two times a day (BID) | ORAL | 1 refills | Status: AC

## 2020-03-18 NOTE — Progress Notes (Signed)
LOW-RISK PREGNANCY OFFICE VISIT Patient name: Madison Soto MRN [id number]  Date of birth: 06-May-1995 Chief Complaint:   Routine Prenatal Visit  History of Present Illness:   Madison Soto is a 25 y.o. G74P1001 female at [redacted]w[redacted]d with an Estimated Date of Delivery: 05/13/20 being seen today for ongoing management of a low-risk pregnancy.  Today she reports no complaints. She reports she did not pick up any Rx's (Metronidazole, BP cuff or scale) sent for her. The FOB has not been treated for trich either. Contractions: Not present. Vag. Bleeding: None.  Movement: Present. denies leaking of fluid. Review of Systems:   Pertinent items are noted in HPI Denies abnormal vaginal discharge w/ itching/odor/irritation, headaches, visual changes, shortness of breath, chest pain, abdominal pain, severe nausea/vomiting, or problems with urination or bowel movements unless otherwise stated above. Pertinent History Reviewed:  Reviewed past medical,surgical, social, obstetrical and family history.  Reviewed problem list, medications and allergies. Physical Assessment:   Vitals:   03/18/20 1413  BP: 104/61  Pulse: 78  Temp: 98.2 F (36.8 C)  Weight: 157 lb (71.2 kg)  Body mass index is 25.34 kg/m.        Physical Examination:   General appearance: Well appearing, and in no distress  Mental status: Alert, oriented to person, place, and time  Skin: Warm & dry  Cardiovascular: Normal heart rate noted  Respiratory: Normal respiratory effort, no distress  Abdomen: Soft, gravid, nontender  Pelvic: Cervical exam deferred         Extremities: Edema: Trace  Fetal Status: Fetal Heart Rate (bpm): 150 Fundal Height: 29 cm Movement: Present Presentation: Undeterminable  No results found for this or any previous visit (from the past 24 hour(s)).  Assessment & Plan:  1) Low-risk pregnancy G2P1001 at [redacted]w[redacted]d with an Estimated Date of Delivery: 05/13/20   2) Supervision of other normal pregnancy, antepartum  -  Rx for Blood Pressure Monitoring (BLOOD PRESSURE MONITOR AUTOMAT) DEVI, Misc. Devices  - Rx for (GOJJI WEIGHT SCALE) Misc. - Discussed the importance of having   3) Trichomonal vaginitis during pregnancy in third trimester  - Rx for metroNIDAZOLE (FLAGYL) 500 MG tablet    Meds:  Meds ordered this encounter  Medications  . metroNIDAZOLE (FLAGYL) 500 MG tablet    Sig: Take 1 tablet (500 mg total) by mouth 2 (two) times daily for 7 days. Take with food.    Dispense:  14 tablet    Refill:  1  . Blood Pressure Monitoring (BLOOD PRESSURE MONITOR AUTOMAT) DEVI    Sig: 1 Device by Does not apply route daily. Automatic blood pressure cuff regular size. To monitor blood pressure regularly at home. ICD-10 code:Z34.90    Dispense:  1 each    Refill:  0  . Misc. Devices (GOJJI WEIGHT SCALE) MISC    Sig: 1 Device by Does not apply route daily as needed. To weight self daily as needed at home. ICD-10 code: Z34.90    Dispense:  1 each    Refill:  0   Labs/procedures today: none  Plan:  Continue routine obstetrical care   Reviewed: Preterm labor symptoms and general obstetric precautions including but not limited to vaginal bleeding, contractions, leaking of fluid and fetal movement were reviewed in detail with the patient.  All questions were answered. Does not have home bp cuff. Rx faxed to [facility]. Check bp weekly, let us know if >140/90.   Follow-up: Return in about 2 weeks (around 04/01/2020) for Return OB - My  Chart video.  No orders of the defined types were placed in this encounter.  Raelyn Mora MSN, CNM 03/18/2020 2:30 PM

## 2020-03-22 ENCOUNTER — Encounter: Payer: Self-pay | Admitting: *Deleted

## 2020-03-23 ENCOUNTER — Other Ambulatory Visit: Payer: Medicaid Other

## 2020-04-01 ENCOUNTER — Telehealth: Payer: Medicaid Other | Admitting: Obstetrics and Gynecology

## 2020-04-21 ENCOUNTER — Inpatient Hospital Stay (HOSPITAL_COMMUNITY)
Admission: EM | Admit: 2020-04-21 | Discharge: 2020-04-21 | Disposition: A | Discharge status: Home / Self Care | Payer: Medicaid Other | Attending: Emergency Medicine | Admitting: Emergency Medicine

## 2020-04-21 ENCOUNTER — Encounter (HOSPITAL_COMMUNITY): Payer: Self-pay | Admitting: Obstetrics and Gynecology

## 2020-04-21 ENCOUNTER — Other Ambulatory Visit: Payer: Self-pay

## 2020-04-21 DIAGNOSIS — Z87891 Personal history of nicotine dependence: Secondary | ICD-10-CM | POA: Diagnosis not present

## 2020-04-21 DIAGNOSIS — N858 Other specified noninflammatory disorders of uterus: Secondary | ICD-10-CM

## 2020-04-21 DIAGNOSIS — A599 Trichomoniasis, unspecified: Secondary | ICD-10-CM | POA: Diagnosis not present

## 2020-04-21 DIAGNOSIS — A5901 Trichomonal vulvovaginitis: Secondary | ICD-10-CM

## 2020-04-21 DIAGNOSIS — O4703 False labor before 37 completed weeks of gestation, third trimester: Secondary | ICD-10-CM | POA: Diagnosis present

## 2020-04-21 DIAGNOSIS — O23593 Infection of other part of genital tract in pregnancy, third trimester: Secondary | ICD-10-CM | POA: Diagnosis not present

## 2020-04-21 DIAGNOSIS — O26893 Other specified pregnancy related conditions, third trimester: Secondary | ICD-10-CM | POA: Diagnosis not present

## 2020-04-21 DIAGNOSIS — Z3A36 36 weeks gestation of pregnancy: Secondary | ICD-10-CM | POA: Diagnosis not present

## 2020-04-21 DIAGNOSIS — Z3480 Encounter for supervision of other normal pregnancy, unspecified trimester: Secondary | ICD-10-CM

## 2020-04-21 LAB — WET PREP, GENITAL
Clue Cells Wet Prep HPF POC: NONE SEEN
Sperm: NONE SEEN
Yeast Wet Prep HPF POC: NONE SEEN

## 2020-04-21 MED ORDER — METRONIDAZOLE 500 MG PO TABS: 500.0000 mg | ORAL_TABLET | Freq: Two times a day (BID) | ORAL | 0 refills | Status: AC

## 2020-04-21 NOTE — MAU Provider Note (Signed)
Chief Complaint:  Contractions   Event Date/Time   First Provider Initiated Contact with Patient 04/21/20 2034     HPI: Madison Soto is a 25 y.o. G2P1001 at 59w6dwho presents to maternity admissions reporting intermittent contractions and vaginal discharge.  Was treated for Trichomonas last month, states did take all her meds. States partner told her he took his meds. She reports good fetal movement, denies LOF, vaginal bleeding, urinary symptoms, h/a, dizziness, n/v, diarrhea, constipation or fever/chills.    Vaginal Discharge The patient's primary symptoms include vaginal discharge. The patient's pertinent negatives include no genital itching, genital odor, pelvic pain or vaginal bleeding. This is a new problem. The current episode started in the past 7 days. The problem has been unchanged. The pain is mild. She is pregnant. Pertinent negatives include no chills, constipation, diarrhea, headaches, nausea or vomiting. The vaginal discharge was thick and white. There has been no bleeding. She has not been passing clots. She has not been passing tissue. Nothing aggravates the symptoms. She has tried nothing for the symptoms. She is sexually active.    RN Note: Patient here for ctx since yesterday.  Also reports some yellow discharge w/o an odor.  Endorses + FM.  Denies VB/LOF.  Denies complications w/ her pregnancy.   Past Medical History: Past Medical History:  Diagnosis Date  . Medical history non-contributory     Past obstetric history: OB History  Gravida Para Term Preterm AB Living  2 1 1     1   SAB IAB Ectopic Multiple Live Births        0 1    # Outcome Date GA Lbr Len/2nd Weight Sex Delivery Anes PTL Lv  2 Current           1 Term 08/02/16 [redacted]w[redacted]d 06:55 / 00:23 3359 g M Vag-Spont EPI  LIV    Past Surgical History: Past Surgical History:  Procedure Laterality Date  . NO PAST SURGERIES      Family History: Family History  Problem Relation Age of Onset  . Hypertension  Paternal Grandmother   . Hypertension Paternal Grandfather     Social History: Social History   Tobacco Use  . Smoking status: Former [redacted]w[redacted]d  . Smokeless tobacco: Never Used  Vaping Use  . Vaping Use: Never used  Substance Use Topics  . Alcohol use: No  . Drug use: No    Allergies: No Known Allergies  Meds:  Medications Prior to Admission  Medication Sig Dispense Refill Last Dose  . Blood Pressure Monitoring (BLOOD PRESSURE MONITOR AUTOMAT) DEVI 1 Device by Does not apply route daily. Automatic blood pressure cuff regular size. To monitor blood pressure regularly at home. ICD-10 code:Z34.90 1 each 0   . Elastic Bandages & Supports (COMFORT FIT MATERNITY SUPP SM) MISC 1 Units by Does not apply route daily as needed. 1 each 0   . Misc. Devices (GOJJI WEIGHT SCALE) MISC 1 Device by Does not apply route daily as needed. To weight self daily as needed at home. ICD-10 code: Z34.90 1 each 0   . Prenat-FeCbn-FeAsp-Meth-FA-DHA (PRENATE MINI) 18-0.6-0.4-350 MG CAPS Take 1 capsule by mouth daily. (Patient not taking: Reported on 03/18/2020) 30 capsule 12   . Prenat-FeCbn-FeAsp-Meth-FA-DHA (PRENATE MINI) 18-0.6-0.4-350 MG CAPS Take 1 capsule by mouth daily. (Patient not taking: Reported on 03/18/2020) 30 capsule 0     I have reviewed patient's Past Medical Hx, Surgical Hx, Family Hx, Social Hx, medications and allergies.   ROS:  Review of Systems  Constitutional: Negative for chills.  Gastrointestinal: Negative for constipation, diarrhea, nausea and vomiting.  Genitourinary: Positive for vaginal discharge. Negative for pelvic pain.  Neurological: Negative for headaches.   Other systems negative  Physical Exam   Patient Vitals for the past 24 hrs:  BP Temp Temp src Pulse Resp SpO2 Weight  04/21/20 1838 111/63 98 F (36.7 C) -- 90 18 -- 72.2 kg  04/21/20 1741 106/69 98.2 F (36.8 C) Oral (!) 107 19 99 % --   Constitutional: Well-developed, well-nourished female in no acute distress.   Cardiovascular: normal rate and rhythm Respiratory: normal effort, clear to auscultation bilaterally GI: Abd soft, non-tender, gravid appropriate for gestational age.   No rebound or guarding. MS: Extremities nontender, no edema, normal ROM Neurologic: Alert and oriented x 4.  GU: Neg CVAT.  PELVIC EXAM: Dilation: 1 Effacement (%): 50 Station: -3 Presentation: Vertex Exam by:: Zenia Resides, RN   Recheck of cervix Dilation: 1 Effacement (%): 50 Station: -3 Presentation: Vertex Exam by:: Zenia Resides, RN   FHT:  Baseline 140 , moderate variability, accelerations present, no decelerations Contractions:  Irregular    Labs: Results for orders placed or performed during the hospital encounter of 04/21/20 (from the past 24 hour(s))  Wet prep, genital     Status: Abnormal   Collection Time: 04/21/20  7:39 PM  Result Value Ref Range   Yeast Wet Prep HPF POC NONE SEEN NONE SEEN   Trich, Wet Prep PRESENT (A) NONE SEEN   Clue Cells Wet Prep HPF POC NONE SEEN NONE SEEN   WBC, Wet Prep HPF POC MANY (A) NONE SEEN   Sperm NONE SEEN    A/Positive/-- (01/06 1502)  Imaging:  No results found.  MAU Course/MDM: I have ordered labs and reviewed results. Discussed presence/recurrence of Trichomonal infection Rx sent for Flagyl retreatment and partner Rx given NST reviewed, reactive  Treatments in MAU included EFM.    Assessment: Single IUP at [redacted]w[redacted]d Irregular contractions Trichomonal vaginitis, recurrent  Plan: Discharge home Rx Flagyl x 7d for Trichomonal vaginitis Labor precautions and fetal kick counts Follow up in Office for prenatal visits   Encouraged to return if she develops worsening of symptoms, increase in pain, fever, or other concerning symptoms.   Pt stable at time of discharge.  Wynelle Bourgeois CNM, MSN Certified Nurse-Midwife 04/21/2020 8:35 PM

## 2020-04-21 NOTE — MAU Provider Note (Signed)
RN labor check. Patient complains of thick white discharge. No bleeding.   MSE complete. Wet prep collected by RN.   Duane Lope, NP 04/21/2020 8:04 PM

## 2020-04-21 NOTE — ED Provider Notes (Signed)
Emergency Medicine Provider OB Triage Evaluation Note  Madison Soto is a 25 y.o. female, G2P1001, at [redacted]w[redacted]d gestation who presents to the emergency department with complaints of abd pain. Started having sharp abd pain yesterday that feels like contractions. She is concerned she passed her mucous plug  Review of  Systems  Positive: abd pain/cramping,  Negative: vaginal bleeding, fevers  Physical Exam  BP 106/69 (BP Location: Left Arm)   Pulse (!) 107   Temp 98.2 F (36.8 C) (Oral)   Resp 19   LMP 08/07/2019 (Within Days)   SpO2 99%  General: Awake, no distress  HEENT: Atraumatic  Resp: Normal effort  Cardiac: Normal rate Abd: gravid abdomen that is mildly ttp to the bilat upper abdomen MSK: Moves all extremities without difficulty Neuro: Speech clear  Medical Decision Making  Pt evaluated for pregnancy concern and is stable for transfer to MAU. Pt is in agreement with plan for transfer.  5:50 PM Discussed with MAU APP, Rollits Arita Miss, who accepts patient in transfer.   Clinical Impression  No diagnosis found.     Rayne Du 04/21/20 1750    Eber Hong, MD 04/22/20 1455

## 2020-04-21 NOTE — MAU Note (Signed)
Patient here for ctx since yesterday.  Also reports some yellow discharge w/o an odor.  Endorses + FM.  Denies VB/LOF.  Denies complications w/ her pregnancy.

## 2020-04-21 NOTE — Discharge Instructions (Signed)
Trichomoniasis Trichomoniasis is an STI (sexually transmitted infection) that can affect both women and men. In women, the outer area of the female genitalia (vulva) and the vagina are affected. In men, mainly the penis is affected, but the prostate and other reproductive organs can also be involved.  This condition can be treated with medicine. It often has no symptoms (is asymptomatic), especially in men. If not treated, trichomoniasis can last for months or years. What are the causes? This condition is caused by a parasite called Trichomonas vaginalis. Trichomoniasis most often spreads from person to person (is contagious) through sexual contact. What increases the risk? The following factors may make you more likely to develop this condition:  Having unprotected sex.  Having sex with a partner who has trichomoniasis.  Having multiple sexual partners.  Having had previous trichomoniasis infections or other STIs. What are the signs or symptoms? In women, symptoms of trichomoniasis include:  Abnormal vaginal discharge that is clear, white, gray, or yellow-green and foamy and has an unusual "fishy" odor.  Itching and irritation of the vagina and vulva.  Burning or pain during urination or sex.  Redness and swelling of the genitals. In men, symptoms of trichomoniasis include:  Penile discharge that may be foamy or contain pus.  Pain in the penis. This may happen only when urinating.  Itching or irritation inside the penis.  Burning after urination or ejaculation. How is this diagnosed? In women, this condition may be found during a routine Pap test or physical exam. It may be found in men during a routine physical exam. Your health care provider may do tests to help diagnose this infection, such as:  Urine tests (men and women).  The following in women: ? Testing the pH of the vagina. ? A vaginal swab test that checks for the Trichomonas vaginalis parasite. ? Testing vaginal  secretions. Your health care provider may test you for other STIs, including HIV (human immunodeficiency virus). How is this treated? This condition is treated with medicine taken by mouth (orally), such as metronidazole or tinidazole, to fight the infection. Your sexual partner(s) also need to be tested and treated.  If you are a woman and you plan to become pregnant or think you may be pregnant, tell your health care provider right away. Some medicines that are used to treat the infection should not be taken during pregnancy. Your health care provider may recommend over-the-counter medicines or creams to help relieve itching or irritation. You may be tested for infection again 3 months after treatment.   Follow these instructions at home:  Take and use over-the-counter and prescription medicines, including creams, only as told by your health care provider.  Take your antibiotic medicine as told by your health care provider. Do not stop taking the antibiotic even if you start to feel better.  Do not have sex until 7-10 days after you finish your medicine, or until your health care provider approves. Ask your health care provider when you may start to have sex again.  (Women) Do not douche or wear tampons while you have the infection.  Discuss your infection with your sexual partner(s). Make sure that your partner gets tested and treated, if necessary.  Keep all follow-up visits as told by your health care provider. This is important. How is this prevented?  Use condoms every time you have sex. Using condoms correctly and consistently can help protect against STIs.  Avoid having multiple sexual partners.  Talk with your sexual partner about   any symptoms that either of you may have, as well as any history of STIs.  Get tested for STIs and STDs (sexually transmitted diseases) before you have sex. Ask your partner to do the same.  Do not have sexual contact if you have symptoms of  trichomoniasis or another STI.   Contact a health care provider if:  You still have symptoms after you finish your medicine.  You develop pain in your abdomen.  You have pain when you urinate.  You have bleeding after sex.  You develop a rash.  You feel nauseous or you vomit.  You plan to become pregnant or think you may be pregnant. Summary  Trichomoniasis is an STI (sexually transmitted infection) that can affect both women and men.  This condition often has no symptoms (is asymptomatic), especially in men.  Without treatment, this condition can last for months or years.  You should not have sex until 7-10 days after you finish your medicine, or until your health care provider approves. Ask your health care provider when you may start to have sex again.  Discuss your infection with your sexual partner(s). Make sure that your partner gets tested and treated, if necessary. This information is not intended to replace advice given to you by your health care provider. Make sure you discuss any questions you have with your health care provider. Document Revised: 11/27/2017 Document Reviewed: 11/27/2017 Elsevier Patient Education  2021 [employer]  

## 2020-04-26 ENCOUNTER — Encounter: Payer: Self-pay | Admitting: *Deleted

## 2020-05-07 ENCOUNTER — Ambulatory Visit (INDEPENDENT_AMBULATORY_CARE_PROVIDER_SITE_OTHER): Payer: Medicaid Other

## 2020-05-07 ENCOUNTER — Other Ambulatory Visit: Payer: Self-pay

## 2020-05-07 VITALS — BP 103/66 | HR 76 | Wt 163.8 lb

## 2020-05-07 DIAGNOSIS — O23593 Infection of other part of genital tract in pregnancy, third trimester: Secondary | ICD-10-CM

## 2020-05-07 DIAGNOSIS — Z3A39 39 weeks gestation of pregnancy: Secondary | ICD-10-CM

## 2020-05-07 DIAGNOSIS — Z3480 Encounter for supervision of other normal pregnancy, unspecified trimester: Secondary | ICD-10-CM

## 2020-05-07 DIAGNOSIS — A5901 Trichomonal vulvovaginitis: Secondary | ICD-10-CM

## 2020-05-07 NOTE — Progress Notes (Signed)
   LOW-RISK PREGNANCY OFFICE VISIT  Patient name: Madison Soto MRN [id number]  Date of birth: 1995-07-12 Chief Complaint:   Routine Prenatal Visit  Subjective:   Madison Soto is a 25 y.o. G34P1001 female at [redacted]w[redacted]d with an Estimated Date of Delivery: 05/13/20 being seen today for ongoing management of a Low-risk pregnancy aeb has Supervision of other normal pregnancy, antepartum and Trichomonal vaginitis during pregnancy in third trimester on their problem list.  Patient presents today with complaint of occasional contractions. However, she denies pain or discomfort.  Patient reports good fetal movement.  Patient denies vaginal concerns including abnormal discharge, leaking of fluid, and bleeding. She reports she completed her medication for her trich infection. Contractions: Not present. Vag. Bleeding: None.  Movement: Present.  Reviewed past medical,surgical, social, obstetrical and family history as well as problem list, medications and allergies.  Objective   Vitals:   05/07/20 1107  BP: 103/66  Pulse: 76  Weight: 163 lb 12.8 oz (74.3 kg)  Body mass index is 26.44 kg/m.  Total Weight Gain:23 lb 12.8 oz (10.8 kg)         Physical Examination:   General appearance: Well appearing, and in no distress  Mental status: Alert, oriented to person, place, and time  Skin: Warm & dry  Cardiovascular: Normal heart rate noted  Respiratory: Normal respiratory effort, no distress  Abdomen: Soft, gravid, nontender, AGA with Fundal height of Fundal Height: 39 cm  Pelvic: Cervical exam performed  Dilation: 2 Effacement (%): 50 Station: -3 Presentation: Vertex  Extremities: Edema: Trace  Fetal Status: Fetal Heart Rate (bpm): 150  Movement: Present   No results found for this or any previous visit (from the past 24 hour(s)).  Assessment & Plan:  Low-risk pregnancy of a 25 y.o., G2P1001 at [redacted]w[redacted]d with an Estimated Date of Delivery: 05/13/20   1. Supervision of other normal pregnancy,  antepartum -Anticipatory guidance for upcoming appts. -Patient to next appt in 1 weeks for an in-person visit. -Labs as below. -Educated on GBS bacteria including what it is, why we test, and how and when we treat if needed. -Discussed releasing of results to mychart. -Brief discussion regarding PP birth control and patient desires depo provera.  2. [redacted] weeks gestation of pregnancy -Doing well overall. -Informed that she will be scheduled for IOl at 41 weeks. -Request made and sent for March 24th at [facility]. -Orders placed.   3. Trichomonal vaginitis during pregnancy in third trimester -Plan for TOC at next visit, which will be 3 weeks since treatment.  -Patient encouraged to utilize condoms.      Meds: No orders of the defined types were placed in this encounter.  Labs/procedures today:   Lab Orders     Culture, beta strep (group b only)   Reviewed: Term labor symptoms and general obstetric precautions including but not limited to vaginal bleeding, contractions, leaking of fluid and fetal movement were reviewed in detail with the patient.  All questions were answered.  Follow-up: Return in about 1 week (around 05/14/2020) for LROB with TOC.  Orders Placed This Encounter  Procedures  . Culture, beta strep (group b only)   Cherre Robins MSN, CNM 05/07/2020

## 2020-05-07 NOTE — Addendum Note (Signed)
Addended by: Gerrit Heck L on: 05/07/2020 11:29 AM   Modules accepted: Orders

## 2020-05-07 NOTE — Patient Instructions (Signed)
Trichomoniasis Trichomoniasis is an STI (sexually transmitted infection) that can affect both women and men. In women, the outer area of the female genitalia (vulva) and the vagina are affected. In men, mainly the penis is affected, but the prostate and other reproductive organs can also be involved.  This condition can be treated with medicine. It often has no symptoms (is asymptomatic), especially in men. If not treated, trichomoniasis can last for months or years. What are the causes? This condition is caused by a parasite called Trichomonas vaginalis. Trichomoniasis most often spreads from person to person (is contagious) through sexual contact. What increases the risk? The following factors may make you more likely to develop this condition:  Having unprotected sex.  Having sex with a partner who has trichomoniasis.  Having multiple sexual partners.  Having had previous trichomoniasis infections or other STIs. What are the signs or symptoms? In women, symptoms of trichomoniasis include:  Abnormal vaginal discharge that is clear, white, gray, or yellow-green and foamy and has an unusual "fishy" odor.  Itching and irritation of the vagina and vulva.  Burning or pain during urination or sex.  Redness and swelling of the genitals. In men, symptoms of trichomoniasis include:  Penile discharge that may be foamy or contain pus.  Pain in the penis. This may happen only when urinating.  Itching or irritation inside the penis.  Burning after urination or ejaculation. How is this diagnosed? In women, this condition may be found during a routine Pap test or physical exam. It may be found in men during a routine physical exam. Your health care provider may do tests to help diagnose this infection, such as:  Urine tests (men and women).  The following in women: ? Testing the pH of the vagina. ? A vaginal swab test that checks for the Trichomonas vaginalis parasite. ? Testing vaginal  secretions. Your health care provider may test you for other STIs, including HIV (human immunodeficiency virus). How is this treated? This condition is treated with medicine taken by mouth (orally), such as metronidazole or tinidazole, to fight the infection. Your sexual partner(s) also need to be tested and treated.  If you are a woman and you plan to become pregnant or think you may be pregnant, tell your health care provider right away. Some medicines that are used to treat the infection should not be taken during pregnancy. Your health care provider may recommend over-the-counter medicines or creams to help relieve itching or irritation. You may be tested for infection again 3 months after treatment.   Follow these instructions at home:  Take and use over-the-counter and prescription medicines, including creams, only as told by your health care provider.  Take your antibiotic medicine as told by your health care provider. Do not stop taking the antibiotic even if you start to feel better.  Do not have sex until 7-10 days after you finish your medicine, or until your health care provider approves. Ask your health care provider when you may start to have sex again.  (Women) Do not douche or wear tampons while you have the infection.  Discuss your infection with your sexual partner(s). Make sure that your partner gets tested and treated, if necessary.  Keep all follow-up visits as told by your health care provider. This is important. How is this prevented?  Use condoms every time you have sex. Using condoms correctly and consistently can help protect against STIs.  Avoid having multiple sexual partners.  Talk with your sexual partner about   any symptoms that either of you may have, as well as any history of STIs.  Get tested for STIs and STDs (sexually transmitted diseases) before you have sex. Ask your partner to do the same.  Do not have sexual contact if you have symptoms of  trichomoniasis or another STI.   Contact a health care provider if:  You still have symptoms after you finish your medicine.  You develop pain in your abdomen.  You have pain when you urinate.  You have bleeding after sex.  You develop a rash.  You feel nauseous or you vomit.  You plan to become pregnant or think you may be pregnant. Summary  Trichomoniasis is an STI (sexually transmitted infection) that can affect both women and men.  This condition often has no symptoms (is asymptomatic), especially in men.  Without treatment, this condition can last for months or years.  You should not have sex until 7-10 days after you finish your medicine, or until your health care provider approves. Ask your health care provider when you may start to have sex again.  Discuss your infection with your sexual partner(s). Make sure that your partner gets tested and treated, if necessary. This information is not intended to replace advice given to you by your health care provider. Make sure you discuss any questions you have with your health care provider. Document Revised: 11/27/2017 Document Reviewed: 11/27/2017 Elsevier Patient Education  2021 Elsevier Inc.  

## 2020-05-11 ENCOUNTER — Other Ambulatory Visit: Payer: Self-pay | Admitting: Advanced Practice Midwife

## 2020-05-11 ENCOUNTER — Telehealth (HOSPITAL_COMMUNITY): Payer: Self-pay | Admitting: *Deleted

## 2020-05-11 LAB — CULTURE, BETA STREP (GROUP B ONLY): Strep Gp B Culture: NEGATIVE

## 2020-05-11 NOTE — Telephone Encounter (Signed)
Preadmission screen  

## 2020-05-12 ENCOUNTER — Ambulatory Visit (INDEPENDENT_AMBULATORY_CARE_PROVIDER_SITE_OTHER): Payer: Medicaid Other | Admitting: Advanced Practice Midwife

## 2020-05-12 ENCOUNTER — Other Ambulatory Visit (HOSPITAL_COMMUNITY)
Admission: RE | Admit: 2020-05-12 | Discharge: 2020-05-12 | Disposition: A | Discharge status: Home / Self Care | Payer: Medicaid Other | Source: Ambulatory Visit | Attending: Advanced Practice Midwife | Admitting: Advanced Practice Midwife

## 2020-05-12 ENCOUNTER — Other Ambulatory Visit: Payer: Self-pay

## 2020-05-12 ENCOUNTER — Encounter: Payer: Self-pay | Admitting: Advanced Practice Midwife

## 2020-05-12 ENCOUNTER — Encounter (HOSPITAL_COMMUNITY): Payer: Self-pay | Admitting: *Deleted

## 2020-05-12 ENCOUNTER — Telehealth (HOSPITAL_COMMUNITY): Payer: Self-pay | Admitting: *Deleted

## 2020-05-12 VITALS — BP 110/68 | HR 87 | Temp 98.5°F | Wt 163.8 lb

## 2020-05-12 DIAGNOSIS — Z113 Encounter for screening for infections with a predominantly sexual mode of transmission: Secondary | ICD-10-CM | POA: Insufficient documentation

## 2020-05-12 DIAGNOSIS — Z3A39 39 weeks gestation of pregnancy: Secondary | ICD-10-CM

## 2020-05-12 DIAGNOSIS — Z3480 Encounter for supervision of other normal pregnancy, unspecified trimester: Secondary | ICD-10-CM | POA: Insufficient documentation

## 2020-05-12 LAB — OB RESULTS CONSOLE GC/CHLAMYDIA: Gonorrhea: NEGATIVE

## 2020-05-12 NOTE — Progress Notes (Signed)
   PRENATAL VISIT NOTE  Subjective:  Madison Soto is a 25 y.o. G2P1001 at [redacted]w[redacted]d being seen today for ongoing prenatal care.  She is currently monitored for the following issues for this low-risk pregnancy and has Supervision of other normal pregnancy, antepartum and Trichomonal vaginitis during pregnancy in third trimester on their problem list.  Patient reports no complaints.  Contractions: Irregular. Vag. Bleeding: None.  Movement: Present. Denies leaking of fluid.   The following portions of the patient's history were reviewed and updated as appropriate: allergies, current medications, past family history, past medical history, past social history, past surgical history and problem list.   Objective:   Vitals:   05/12/20 1526  BP: 110/68  Pulse: 87  Temp: 98.5 F (36.9 C)  Weight: 163 lb 12.8 oz (74.3 kg)    Fetal Status: Fetal Heart Rate (bpm): 144 Fundal Height: 40 cm Movement: Present     General:  Alert, oriented and cooperative. Patient is in no acute distress.  Skin: Skin is warm and dry. No rash noted.   Cardiovascular: Normal heart rate noted  Respiratory: Normal respiratory effort, no problems with respiration noted  Abdomen: Soft, gravid, appropriate for gestational age.  Pain/Pressure: Present     Pelvic: Cervical exam deferred        Extremities: Normal range of motion.  Edema: None  Mental Status: Normal mood and affect. Normal behavior. Normal judgment and thought content.   Assessment and Plan:  Pregnancy: G2P1001 at [redacted]w[redacted]d 1. Screen for STD (sexually transmitted disease) - Urine cytology ancillary only([facility])  2. Supervision of other normal pregnancy, antepartum - Patient declined cervical check today. She was thinking about membrane sweep, but has maternity pics scheduled for this weekend. So doesn't want check or sweep today.   3. [redacted] weeks gestation of pregnancy - Has IOL scheduled for 41 weeks. Needs BPP/NST at [facility] - [place] [facility] FETAL BPP W/NONSTRESS;  Future  Term labor symptoms and general obstetric precautions including but not limited to vaginal bleeding, contractions, leaking of fluid and fetal movement were reviewed in detail with the patient. Please refer to After Visit Summary for other counseling recommendations.   Return in about 5 days (around 05/17/2020).  Future Appointments  Date Time Provider Department Center  05/18/2020  9:45 AM [facility]-SCREENING [facility] None  05/20/2020 12:00 AM [facility]LD [facility] ROOM [facility] None   Thressa Sheller DNP, CNM  05/12/20  4:01 PM

## 2020-05-12 NOTE — Telephone Encounter (Signed)
Preadmission screen  

## 2020-05-13 LAB — URINE CYTOLOGY ANCILLARY ONLY
Chlamydia: NEGATIVE
Comment: NEGATIVE
Comment: NEGATIVE
Comment: NORMAL
Neisseria Gonorrhea: NEGATIVE
Trichomonas: NEGATIVE

## 2020-05-17 ENCOUNTER — Encounter: Payer: Self-pay | Admitting: *Deleted

## 2020-05-17 ENCOUNTER — Other Ambulatory Visit: Payer: Self-pay

## 2020-05-17 ENCOUNTER — Ambulatory Visit: Payer: Medicaid Other | Attending: Advanced Practice Midwife | Admitting: *Deleted

## 2020-05-17 ENCOUNTER — Ambulatory Visit: Payer: Medicaid Other | Admitting: *Deleted

## 2020-05-17 DIAGNOSIS — Z3480 Encounter for supervision of other normal pregnancy, unspecified trimester: Secondary | ICD-10-CM

## 2020-05-17 DIAGNOSIS — O480 Post-term pregnancy: Secondary | ICD-10-CM | POA: Diagnosis not present

## 2020-05-17 DIAGNOSIS — Z3A40 40 weeks gestation of pregnancy: Secondary | ICD-10-CM | POA: Insufficient documentation

## 2020-05-17 NOTE — Procedures (Signed)
Madison Soto 07/23/1995 [redacted]w[redacted]d  Fetus A Non-Stress Test Interpretation for 05/17/20  Indication: Postdates  Fetal Heart Rate A Mode: External Baseline Rate (A): 145 bpm Variability: Moderate Accelerations: 15 x 15 Decelerations: None Multiple birth?: No  Uterine Activity Mode: Palpation,Toco Contraction Frequency (min): Irreg Contraction Duration (sec): 60-80 Contraction Quality: Mild Resting Tone Palpated: Relaxed Resting Time: Adequate  Interpretation (Fetal Testing) Nonstress Test Interpretation: Reactive Comments: Tracing reviewed by Dr. Judeth Cornfield

## 2020-05-18 ENCOUNTER — Other Ambulatory Visit (HOSPITAL_COMMUNITY)
Admission: RE | Admit: 2020-05-18 | Discharge: 2020-05-18 | Disposition: A | Discharge status: Home / Self Care | Payer: Medicaid Other | Source: Ambulatory Visit | Attending: Family Medicine | Admitting: Family Medicine

## 2020-05-18 DIAGNOSIS — Z20822 Contact with and (suspected) exposure to covid-19: Secondary | ICD-10-CM | POA: Insufficient documentation

## 2020-05-18 DIAGNOSIS — Z01812 Encounter for preprocedural laboratory examination: Secondary | ICD-10-CM | POA: Insufficient documentation

## 2020-05-18 LAB — SARS CORONAVIRUS 2 (TAT 6-24 HRS): SARS Coronavirus 2: NEGATIVE

## 2020-05-19 ENCOUNTER — Other Ambulatory Visit (HOSPITAL_COMMUNITY)
Admission: RE | Admit: 2020-05-19 | Discharge: 2020-05-19 | Disposition: A | Discharge status: Home / Self Care | Payer: Medicaid Other | Source: Ambulatory Visit | Attending: Women's Health | Admitting: Women's Health

## 2020-05-19 ENCOUNTER — Ambulatory Visit (INDEPENDENT_AMBULATORY_CARE_PROVIDER_SITE_OTHER): Payer: Medicaid Other | Admitting: Women's Health

## 2020-05-19 ENCOUNTER — Inpatient Hospital Stay (HOSPITAL_COMMUNITY)
Admission: AD | Admit: 2020-05-19 | Discharge: 2020-05-21 | DRG: 806 | Disposition: A | Discharge status: Home / Self Care | Payer: Medicaid Other | Attending: Obstetrics & Gynecology | Admitting: Obstetrics & Gynecology

## 2020-05-19 ENCOUNTER — Other Ambulatory Visit: Payer: Self-pay

## 2020-05-19 ENCOUNTER — Encounter (HOSPITAL_COMMUNITY): Payer: Self-pay | Admitting: Family Medicine

## 2020-05-19 VITALS — BP 117/73 | HR 82 | Temp 98.0°F | Wt 168.0 lb

## 2020-05-19 DIAGNOSIS — O23593 Infection of other part of genital tract in pregnancy, third trimester: Secondary | ICD-10-CM | POA: Diagnosis not present

## 2020-05-19 DIAGNOSIS — Z3A40 40 weeks gestation of pregnancy: Secondary | ICD-10-CM

## 2020-05-19 DIAGNOSIS — O480 Post-term pregnancy: Secondary | ICD-10-CM | POA: Diagnosis present

## 2020-05-19 DIAGNOSIS — O99324 Drug use complicating childbirth: Secondary | ICD-10-CM | POA: Diagnosis present

## 2020-05-19 DIAGNOSIS — A5901 Trichomonal vulvovaginitis: Secondary | ICD-10-CM | POA: Diagnosis present

## 2020-05-19 DIAGNOSIS — O368330 Maternal care for abnormalities of the fetal heart rate or rhythm, third trimester, not applicable or unspecified: Secondary | ICD-10-CM | POA: Diagnosis not present

## 2020-05-19 DIAGNOSIS — F1290 Cannabis use, unspecified, uncomplicated: Secondary | ICD-10-CM | POA: Diagnosis present

## 2020-05-19 DIAGNOSIS — Z87891 Personal history of nicotine dependence: Secondary | ICD-10-CM

## 2020-05-19 DIAGNOSIS — Z3480 Encounter for supervision of other normal pregnancy, unspecified trimester: Secondary | ICD-10-CM

## 2020-05-19 DIAGNOSIS — Z20822 Contact with and (suspected) exposure to covid-19: Secondary | ICD-10-CM | POA: Diagnosis present

## 2020-05-19 DIAGNOSIS — Z3A41 41 weeks gestation of pregnancy: Secondary | ICD-10-CM | POA: Diagnosis not present

## 2020-05-19 DIAGNOSIS — M5431 Sciatica, right side: Secondary | ICD-10-CM

## 2020-05-19 HISTORY — DX: Unspecified asthma, uncomplicated: J45.909

## 2020-05-19 LAB — RAPID URINE DRUG SCREEN, HOSP PERFORMED
Amphetamines: NOT DETECTED
Barbiturates: NOT DETECTED
Benzodiazepines: NOT DETECTED
Cocaine: NOT DETECTED
Opiates: NOT DETECTED
Tetrahydrocannabinol: POSITIVE — AB

## 2020-05-19 LAB — TYPE AND SCREEN
ABO/RH(D): A POS
Antibody Screen: NEGATIVE

## 2020-05-19 LAB — CBC
HCT: 31.5 % — ABNORMAL LOW (ref 36.0–46.0)
Hemoglobin: 10.4 g/dL — ABNORMAL LOW (ref 12.0–15.0)
MCH: 29.5 pg (ref 26.0–34.0)
MCHC: 33 g/dL (ref 30.0–36.0)
MCV: 89.2 fL (ref 80.0–100.0)
Platelets: 284 10*3/uL (ref 150–400)
RBC: 3.53 MIL/uL — ABNORMAL LOW (ref 3.87–5.11)
RDW: 14.8 % (ref 11.5–15.5)
WBC: 7.9 10*3/uL (ref 4.0–10.5)
nRBC: 0 % (ref 0.0–0.2)

## 2020-05-19 MED ORDER — TERBUTALINE SULFATE 1 MG/ML IJ SOLN: 0.2500 mg | Freq: Once | INTRAMUSCULAR | Status: DC | PRN

## 2020-05-19 MED ORDER — LIDOCAINE HCL (PF) 1 % IJ SOLN: 30.0000 mL | INTRAMUSCULAR | Status: DC | PRN

## 2020-05-19 MED ORDER — ACETAMINOPHEN 325 MG PO TABS
650.0000 mg | ORAL_TABLET | ORAL | Status: DC | PRN
Start: 2020-05-19 — End: 2020-05-20

## 2020-05-19 MED ORDER — OXYTOCIN BOLUS FROM INFUSION
333.0000 mL | Freq: Once | INTRAVENOUS | Status: AC
  Administered 2020-05-20: 333 mL via INTRAVENOUS

## 2020-05-19 MED ORDER — LACTATED RINGERS IV SOLN
500.0000 mL | INTRAVENOUS | Status: DC | PRN
  Administered 2020-05-19: 500 mL via INTRAVENOUS

## 2020-05-19 MED ORDER — SOD CITRATE-CITRIC ACID 500-334 MG/5ML PO SOLN: 30.0000 mL | ORAL | Status: DC | PRN

## 2020-05-19 MED ORDER — OXYTOCIN-SODIUM CHLORIDE 30-0.9 UT/500ML-% IV SOLN: 2.5000 [IU]/h | INTRAVENOUS | Status: DC

## 2020-05-19 MED ORDER — FENTANYL CITRATE (PF) 100 MCG/2ML IJ SOLN
50.0000 ug | INTRAMUSCULAR | Status: AC | PRN
  Administered 2020-05-20 (×6): 100 ug via INTRAVENOUS
  Filled 2020-05-19 (×6): qty 2

## 2020-05-19 MED ORDER — ONDANSETRON HCL 4 MG/2ML IJ SOLN
4.0000 mg | Freq: Four times a day (QID) | INTRAMUSCULAR | Status: DC | PRN
  Administered 2020-05-20: 4 mg via INTRAVENOUS
  Filled 2020-05-19: qty 2

## 2020-05-19 MED ORDER — OXYTOCIN-SODIUM CHLORIDE 30-0.9 UT/500ML-% IV SOLN
1.0000 m[IU]/min | INTRAVENOUS | Status: DC
  Administered 2020-05-19: 2 m[IU]/min via INTRAVENOUS
  Filled 2020-05-19: qty 500

## 2020-05-19 MED ORDER — LACTATED RINGERS IV SOLN: INTRAVENOUS | Status: DC

## 2020-05-19 NOTE — Patient Instructions (Signed)
Maternity Assessment Unit (MAU)  The Maternity Assessment Unit (MAU) is located at the [facility] and [facility] at [facility]. The address is: [address] [place], [place], [place] [address]. Please see map below for additional directions.    The Maternity Assessment Unit is designed to help you during your pregnancy, and for up to 6 weeks after delivery, with any pregnancy- or postpartum-related emergencies, if you think you are in labor, or if your water has broken. For example, if you experience nausea and vomiting, vaginal bleeding, severe abdominal or pelvic pain, elevated blood pressure or other problems related to your pregnancy or postpartum time, please come to the Maternity Assessment Unit for assistance.       New Induction of Labor Process for [employer] and [facility]  In Fall 2020 [facility] changed it's process for scheduling inductions of labor to create more induction slots and to make sure patients get COVID-19 testing in advance. After you have been tested you need to quarantine so that you do not get infected after your test. You should not go anywhere after your test except necessary medical appointments.  You have been scheduled for induction of labor on 05/20/2020. Although you may have a specific time listed on your After Visit Summary or MyChart, we cannot predict when your room will be available. Please disregard this time. A Labor and Delivery staff member will call you on the day that you are scheduled when your room is available. You will need to arrive within one hour of being called. If you do not arrive within this time frame, the next person on the list will be called in and you will move down the list. You may eat a light meal before coming to the hospital. If you go into labor, think your water has broken, experience bright red bleeding or don't feel your baby moving as much as usual before  your induction, please call your Ob/Gyn's office or come to Entrance C, Maternity Assessment Unit for evaluation.  Thank you,  Center for [facility]        Signs and Symptoms of Labor Labor is the body's natural process of moving the baby and the placenta out of the uterus. The process of labor usually starts when the baby is full-term, between 51 and 40 weeks of pregnancy. Signs and symptoms that you are close to going into labor As your body prepares for labor and the birth of your baby, you may notice the following symptoms in the weeks and days before true labor starts:  Passing a small amount of thick, bloody mucus from your vagina. This is called normal bloody show or losing your mucus plug. This may happen more than a week before labor begins, or right before labor begins, as the opening of the cervix starts to widen (dilate). For some women, the entire mucus plug passes at once. For others, pieces of the mucus plug may gradually pass over several days.  Your baby moving (dropping) lower in your pelvis to get into position for birth (lightening). When this happens, you may feel more pressure on your bladder and pelvic bone and less pressure on your ribs. This may make it easier to breathe. It may also cause you to need to urinate more often and have problems with bowel movements.  Having "practice contractions," also called Braxton Hicks contractions or false labor. These occur at irregular (unevenly spaced) intervals that are more than  10 minutes apart. False labor contractions are common after exercise or sexual activity. They will stop if you change position, rest, or drink fluids. These contractions are usually mild and do not get stronger over time. They may feel like: ? A backache or back pain. ? Mild cramps, similar to menstrual cramps. ? Tightening or pressure in your abdomen. Other early symptoms include:  Nausea or loss of appetite.  Diarrhea.  Having a sudden  burst of energy, or feeling very tired.  Mood changes.  Having trouble sleeping.   Signs and symptoms that labor has begun Signs that you are in labor may include:  Having contractions that come at regular (evenly spaced) intervals and increase in intensity. This may feel like more intense tightening or pressure in your abdomen that moves to your back. ? Contractions may also feel like rhythmic pain in your upper thighs or back that comes and goes at regular intervals. ? For first-time mothers, this change in intensity of contractions often occurs at a more gradual pace. ? Women who have given birth before may notice a more rapid progression of contraction changes.  Feeling pressure in the vaginal area.  Your water breaking (rupture of membranes). This is when the sac of fluid that surrounds your baby breaks. Fluid leaking from your vagina may be clear or blood-tinged. Labor usually starts within 24 hours of your water breaking, but it may take longer to begin. ? Some women may feel a sudden gush of fluid. ? Others notice that their underwear repeatedly becomes damp. Follow these instructions at home:  When labor starts, or if your water breaks, call your health care provider or nurse care line. Based on your situation, they will determine when you should go in for an exam.  During early labor, you may be able to rest and manage symptoms at home. Some strategies to try at home include: ? Breathing and relaxation techniques. ? Taking a warm bath or shower. ? Listening to music. ? Using a heating pad on the lower back for pain. If you are directed to use heat:  Place a towel between your skin and the heat source.  Leave the heat on for 20-30 minutes.  Remove the heat if your skin turns bright red. This is especially important if you are unable to feel pain, heat, or cold. You may have a greater risk of getting burned.   Contact a health care provider if:  Your labor has  started.  Your water breaks. Get help right away if:  You have painful, regular contractions that are 5 minutes apart or less.  Labor starts before you are [redacted] weeks along in your pregnancy.  You have a fever.  You have bright red blood coming from your vagina.  You do not feel your baby moving.  You have a severe headache with or without vision problems.  You have severe nausea, vomiting, or diarrhea.  You have chest pain or shortness of breath. These symptoms may represent a serious problem that is an emergency. Do not wait to see if the symptoms will go away. Get medical help right away. Call your local emergency services (911 in the [place] Do not drive yourself to the hospital. Summary  Labor is your body's natural process of moving your baby and the placenta out of your uterus.  The process of labor usually starts when your baby is full-term, between 73 and 40 weeks of pregnancy.  When labor starts, or if your water breaks,  call your health care provider or nurse care line. Based on your situation, they will determine when you should go in for an exam. This information is not intended to replace advice given to you by your health care provider. Make sure you discuss any questions you have with your health care provider. Document Revised: 12/06/2019 Document Reviewed: 12/06/2019 Elsevier Patient Education  2021 [employer].

## 2020-05-19 NOTE — H&P (Addendum)
OBSTETRIC ADMISSION HISTORY AND PHYSICAL   Madison Soto is a 25 y.o. female G2P1001 with IUP at [redacted]w[redacted]d by LMP presenting for IOL d/t non-reactive NST in office this afternoon. She reports +FMs, No LOF, no VB, no blurry vision, headaches or peripheral edema, and RUQ pain.  She plans on bottle feeding. She plans to use depo for birth control at postpartum visit. She received her prenatal care at [facility].  Dating: LMP  --->  Estimated Date of Delivery: 05/13/20  Sono:  None (late to [facility])  Prenatal History/Complications:  Late to [facility] (30 wks) 3rd trimester trich x2 (treated both times), TOC pending (ordered today) [facility] [employer]al [employer]rier   Past Medical History: Past Medical History:  Diagnosis Date  . Medical history non-contributory     Past Surgical History: Past Surgical History:  Procedure Laterality Date  . NO PAST SURGERIES      Obstetrical History: OB History    Gravida  2   Para  1   Term  1   Preterm      AB      Living  1     SAB      IAB      Ectopic      Multiple  0   Live Births  1           Social History Social History   Socioeconomic History  . Marital status: Single    Spouse name: Not on file  . Number of children: 1  . Years of education: Not on file  . Highest education level: High school graduate  Occupational History  . Not on file  Tobacco Use  . Smoking status: Former Games developer  . Smokeless tobacco: Never Used  Vaping Use  . Vaping Use: Never used  Substance and Sexual Activity  . Alcohol use: No  . Drug use: No  . Sexual activity: Yes    Birth control/protection: Injection  Other Topics Concern  . Not on file  Social History Narrative  . Not on file   Social Determinants of Health   Financial Resource Strain: Not on file  Food Insecurity: Not on file  Transportation Needs: Not on file  Physical Activity: Not on file  Stress: Not on file  Social Connections: Not on file    Family History: Family History  Problem  Relation Age of Onset  . Hypertension Paternal Grandmother   . Hypertension Paternal Grandfather     Allergies: No Known Allergies  Medications Prior to Admission  Medication Sig Dispense Refill Last Dose  . Blood Pressure Monitoring (BLOOD PRESSURE MONITOR AUTOMAT) DEVI 1 Device by Does not apply route daily. Automatic blood pressure cuff regular size. To monitor blood pressure regularly at home. ICD-10 code:Z34.90 1 each 0   . Elastic Bandages & Supports (COMFORT FIT MATERNITY SUPP SM) MISC 1 Units by Does not apply route daily as needed. (Patient not taking: Reported on 05/12/2020) 1 each 0   . Misc. Devices (GOJJI WEIGHT SCALE) MISC 1 Device by Does not apply route daily as needed. To weight self daily as needed at home. ICD-10 code: Z34.90 1 each 0   . Prenat-FeCbn-FeAsp-Meth-FA-DHA (PRENATE MINI) 18-0.6-0.4-350 MG CAPS Take 1 capsule by mouth daily. (Patient not taking: No sig reported) 30 capsule 12   . Prenat-FeCbn-FeAsp-Meth-FA-DHA (PRENATE MINI) 18-0.6-0.4-350 MG CAPS Take 1 capsule by mouth daily. 30 capsule 0      Review of Systems   All systems reviewed and negative except as stated in  HPI  Blood pressure (!) 109/57, pulse 70, temperature 98.5 F (36.9 C), temperature source Oral, resp. rate 18, height 5\' 6"  (1.676 m), weight 76.4 kg, last menstrual period 08/07/2019, currently breastfeeding. General appearance: alert Lungs: clear to auscultation bilaterally, nl respiratory effort Heart: regular rate and rhythm Abdomen: soft, non-tender, gravid Presentation: cephalic on abdo exam Cervical Exam: 2, 50, -3 at 1756 Fetal monitoring: baseline 150, moderate variability, positive accels, decels Uterine activity: irregular UC, q68min  Prenatal labs: ABO, Rh: A/Positive/-- (01/06 1502) Antibody: Negative (01/06 1502) Rubella: 1.99 (01/06 1502) RPR: Non Reactive (01/06 1502)  HBsAg: Negative (01/06 1502)  HIV: Non Reactive (01/06 1502)  GBS: Negative/-- (03/11 1122)  2hr  GTT: WNL Genetic screening: Horizon/Panorama 03/04/2020 Anatomy 05/02/2020: NA, late to [facility]  Prenatal Transfer Tool  Maternal Diabetes: No Genetic Screening: Abnormal:  Results: Other: 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 Fetal Ultrasounds or other Referrals:  None Maternal Substance Abuse:  No Significant Maternal Medications:  None Significant Maternal Lab Results: Group B Strep negative  No results found for this or any previous visit (from the past 24 hour(s)).  Patient Active Problem List   Diagnosis Date Noted  . Indication for care or intervention in labor or delivery 05/19/2020  . Trichomonal vaginitis during pregnancy in third trimester 03/18/2020  . Supervision of other normal pregnancy, antepartum 03/04/2020    Assessment/Plan:  Madison Soto is a 25 y.o. G2P1001 with IUP at [redacted]w[redacted]d by LMP presenting for IOL d/t non-reactive NST in office this afternoon.  #IOL for Postdates: Discussed IOL process w/ pt. Cervix favorable on exam: 2, 50, -3. Started 53mu/min pit at 1759 and titrating up. Anticipate VD.  #Pain: No epidural, IV pain meds prn #FWB: Category I  #ID:  GBS negative, 3rd trimester trich x2 TOC pending #Late to [facility]: established care at 30wks. No [facility]. Social work consult postpartum  #MOF: bottle #MOC: depo at pp #Circ:  yes  [place], Medical Student  05/19/2020, 4:49 PM    I saw and evaluated the patient. I agree with the findings and the plan of care as documented in the medical student's note.  05/21/2020, MD [facility] Family Medicine Fellow, [facility] for [facility], [facility]

## 2020-05-19 NOTE — Progress Notes (Signed)
Subjective:  Madison Soto is a 25 y.o. G2P1001 at [redacted]w[redacted]d being seen today for ongoing prenatal care.  She is currently monitored for the following issues for this low-risk pregnancy and has Supervision of other normal pregnancy, antepartum and Trichomonal vaginitis during pregnancy in third trimester on their problem list.  Patient reports no complaints.  Contractions: Irregular. Vag. Bleeding: None.  Movement: Present. Denies leaking of fluid.   The following portions of the patient's history were reviewed and updated as appropriate: allergies, current medications, past family history, past medical history, past social history, past surgical history and problem list. Problem list updated.  Objective:   Vitals:   05/19/20 1323  BP: 117/73  Pulse: 82  Temp: 98 F (36.7 C)  Weight: 168 lb (76.2 kg)    Fetal Status: Fetal Heart Rate (bpm): 145   Movement: Present     General:  Alert, oriented and cooperative. Patient is in no acute distress.  Skin: Skin is warm and dry. No rash noted.   Cardiovascular: Normal heart rate noted  Respiratory: Normal respiratory effort, no problems with respiration noted  Abdomen: Soft, gravid, appropriate for gestational age. Pain/Pressure: Present     Pelvic: Vag. Bleeding: None     Cervical exam performed Dilation: 2 Effacement (%): 50 Station: -3  Extremities: Normal range of motion.  Edema: None  Mental Status: Normal mood and affect. Normal behavior. Normal judgment and thought content.   Urinalysis:      Assessment and Plan:  Pregnancy: G2P1001 at [redacted]w[redacted]d  1. Supervision of other normal pregnancy, antepartum -IOL scheduled for tomorrow -NST performed 05/17/2020 at [facility] - reactive per Dr. Judeth Cornfield -EFM: non-reactive       -baseline: 140/135       -variability: minimal/moderate       -accels: absent       -decels: absent       -TOCO: few, regular ctx -pt to L&D for direct admission, Dr. Myriam Jacobson notified  2. Trichomonal vaginitis during  pregnancy in third trimester -TOC today  Term labor symptoms and general obstetric precautions including but not limited to vaginal bleeding, contractions, leaking of fluid and fetal movement were reviewed in detail with the patient. Please refer to After Visit Summary for other counseling recommendations.  Return for to L&D for direct admission d/t non-reactive nst.   Laterrian Hevener, Odie Sera, NP

## 2020-05-19 NOTE — Progress Notes (Signed)
Patient ID: Madison Soto, female   DOB: [birth year], 25 y.o.   MRN: [id number]  States ctx are becoming a little stronger now, but she is doing well; Pitocin started at 1800  BPs 96/58 FHR 130-140s, +accels, no decels Ctx irreg 2-5 mins with Pit at 45mu/min Cx was 2-3/50/vtx -3 at RN exam 2048 (essentially unchanged)  IUP@40 .6wks IOL process  Continue to titrate Pit to achieve consistently reg ctx Consider AROM when she has some cx change Anticipate vag del  [name] [facility] 05/19/2020 9:54 PM

## 2020-05-20 ENCOUNTER — Inpatient Hospital Stay (HOSPITAL_COMMUNITY)
Admission: AD | Admit: 2020-05-20 | Payer: Medicaid Other | Source: Home / Self Care | Admitting: Obstetrics & Gynecology

## 2020-05-20 ENCOUNTER — Inpatient Hospital Stay (HOSPITAL_COMMUNITY): Payer: Medicaid Other

## 2020-05-20 ENCOUNTER — Encounter (HOSPITAL_COMMUNITY): Payer: Self-pay | Admitting: Family Medicine

## 2020-05-20 DIAGNOSIS — Z3A41 41 weeks gestation of pregnancy: Secondary | ICD-10-CM

## 2020-05-20 DIAGNOSIS — O368330 Maternal care for abnormalities of the fetal heart rate or rhythm, third trimester, not applicable or unspecified: Secondary | ICD-10-CM

## 2020-05-20 DIAGNOSIS — O480 Post-term pregnancy: Secondary | ICD-10-CM

## 2020-05-20 LAB — CERVICOVAGINAL ANCILLARY ONLY
Chlamydia: NEGATIVE
Comment: NEGATIVE
Comment: NEGATIVE
Comment: NORMAL
Neisseria Gonorrhea: NEGATIVE
Trichomonas: NEGATIVE

## 2020-05-20 LAB — RPR: RPR Ser Ql: NONREACTIVE

## 2020-05-20 MED ORDER — DIPHENHYDRAMINE HCL 50 MG/ML IJ SOLN: 12.5000 mg | INTRAMUSCULAR | Status: DC | PRN

## 2020-05-20 MED ORDER — COVID-19 MRNA VACC (MODERNA) 100 MCG/0.5ML IM SUSP
0.5000 mL | Freq: Once | INTRAMUSCULAR | Status: AC
  Administered 2020-05-21: 0.5 mL via INTRAMUSCULAR
  Filled 2020-05-20: qty 0.5

## 2020-05-20 MED ORDER — SENNOSIDES-DOCUSATE SODIUM 8.6-50 MG PO TABS
2.0000 | ORAL_TABLET | Freq: Every day | ORAL | Status: DC
  Administered 2020-05-21: 2 via ORAL
  Filled 2020-05-20: qty 2

## 2020-05-20 MED ORDER — MEDROXYPROGESTERONE ACETATE 150 MG/ML IM SUSP: 150.0000 mg | Freq: Once | INTRAMUSCULAR | Status: DC

## 2020-05-20 MED ORDER — PHENYLEPHRINE 40 MCG/ML (10ML) SYRINGE FOR IV PUSH (FOR BLOOD PRESSURE SUPPORT): 80.0000 ug | PREFILLED_SYRINGE | INTRAVENOUS | Status: DC | PRN

## 2020-05-20 MED ORDER — DIBUCAINE (PERIANAL) 1 % EX OINT: 1.0000 "application " | TOPICAL_OINTMENT | CUTANEOUS | Status: DC | PRN

## 2020-05-20 MED ORDER — DIPHENHYDRAMINE HCL 25 MG PO CAPS: 25.0000 mg | ORAL_CAPSULE | Freq: Four times a day (QID) | ORAL | Status: DC | PRN

## 2020-05-20 MED ORDER — BENZOCAINE-MENTHOL 20-0.5 % EX AERO
1.0000 | INHALATION_SPRAY | CUTANEOUS | Status: DC | PRN
Start: 2020-05-20 — End: 2020-05-21
  Administered 2020-05-20: 1 via TOPICAL
  Filled 2020-05-20: qty 56

## 2020-05-20 MED ORDER — TETANUS-DIPHTH-ACELL PERTUSSIS 5-2.5-18.5 LF-MCG/0.5 IM SUSY: 0.5000 mL | PREFILLED_SYRINGE | Freq: Once | INTRAMUSCULAR | Status: DC

## 2020-05-20 MED ORDER — ACETAMINOPHEN 325 MG PO TABS
650.0000 mg | ORAL_TABLET | Freq: Four times a day (QID) | ORAL | Status: DC
  Administered 2020-05-20 – 2020-05-21 (×5): 650 mg via ORAL
  Filled 2020-05-20 (×5): qty 2

## 2020-05-20 MED ORDER — WITCH HAZEL-GLYCERIN EX PADS: 1.0000 "application " | MEDICATED_PAD | CUTANEOUS | Status: DC | PRN

## 2020-05-20 MED ORDER — IBUPROFEN 600 MG PO TABS
600.0000 mg | ORAL_TABLET | Freq: Four times a day (QID) | ORAL | Status: DC
  Administered 2020-05-20 – 2020-05-21 (×5): 600 mg via ORAL
  Filled 2020-05-20 (×5): qty 1

## 2020-05-20 MED ORDER — EPHEDRINE 5 MG/ML INJ: 10.0000 mg | INTRAVENOUS | Status: DC | PRN

## 2020-05-20 MED ORDER — COCONUT OIL OIL: 1.0000 "application " | TOPICAL_OIL | Status: DC | PRN

## 2020-05-20 MED ORDER — FENTANYL CITRATE (PF) 100 MCG/2ML IJ SOLN
INTRAMUSCULAR | Status: AC
  Administered 2020-05-20: 100 ug via INTRAVENOUS
  Filled 2020-05-20: qty 2

## 2020-05-20 MED ORDER — ONDANSETRON HCL 4 MG PO TABS
4.0000 mg | ORAL_TABLET | ORAL | Status: DC | PRN
Start: 2020-05-20 — End: 2020-05-21

## 2020-05-20 MED ORDER — SIMETHICONE 80 MG PO CHEW: 80.0000 mg | CHEWABLE_TABLET | ORAL | Status: DC | PRN

## 2020-05-20 MED ORDER — FENTANYL CITRATE (PF) 100 MCG/2ML IJ SOLN: 100.0000 ug | INTRAMUSCULAR | Status: DC | PRN

## 2020-05-20 MED ORDER — LACTATED RINGERS IV SOLN: 500.0000 mL | Freq: Once | INTRAVENOUS | Status: DC

## 2020-05-20 MED ORDER — PRENATAL MULTIVITAMIN CH
1.0000 | ORAL_TABLET | Freq: Every day | ORAL | Status: DC
  Administered 2020-05-20 – 2020-05-21 (×2): 1 via ORAL
  Filled 2020-05-20 (×2): qty 1

## 2020-05-20 MED ORDER — FENTANYL-BUPIVACAINE-NACL 0.5-0.125-0.9 MG/250ML-% EP SOLN: 12.0000 mL/h | EPIDURAL | Status: DC | PRN

## 2020-05-20 MED ORDER — ONDANSETRON HCL 4 MG/2ML IJ SOLN: 4.0000 mg | INTRAMUSCULAR | Status: DC | PRN

## 2020-05-20 NOTE — Progress Notes (Signed)
Madison Soto is a 25 y.o. G2P1001 at [redacted]w[redacted]d by LMP admitted for induction of labor due to nonreactive NST.  Subjective: Patient doing well  Objective: BP (!) 107/58   Pulse (!) 59   Temp 98.2 F (36.8 C) (Oral)   Resp 16   Ht 5\' 6"  (1.676 m)   Wt 76.4 kg   LMP 08/07/2019 (Within Days)   SpO2 98%   BMI 27.18 kg/m  No intake/output data recorded. No intake/output data recorded.  FHT:  FHR: 125 bpm, variability: moderate,  accelerations:  Present,  decelerations:  Absent UC:   regular, every 1-2 minutes SVE:   Dilation: 4 Effacement (%): 60,70 Station: -2 Exam by:: [email or web address] [id number], RN  Labs: Lab Results  Component Value Date   WBC 7.9 05/19/2020   HGB 10.4 (L) 05/19/2020   HCT 31.5 (L) 05/19/2020   MCV 89.2 05/19/2020   PLT 284 05/19/2020    Assessment / Plan: Induction of labor due to nonreactive NST,  progressing well on pitocin  Labor: Progressing on Pitocin, will continue to increase then AROM Fetal Wellbeing:  Category I Pain Control:  IV pain meds I/D:  GBS negative Anticipated MOD:  NSVD  05/21/2020 Ashutosh Dieguez 05/20/2020, 7:05 AM

## 2020-05-20 NOTE — Progress Notes (Signed)
Patient ID: Madison Soto, female   DOB: September 04, 1995, 25 y.o.   MRN: [id number]  Pt is resting; recently received 2nd dose of Fentanyl  VSS, afebrile FHR 140s, +accels, no decels, occ variables Ctx q 2 mins with Pit at 74mu/min Cx deferred  UDS +THC  IUP@41 .0wks IOL process  Keep ctx reg with Pitocin Discuss AROM in the next few hours Anticipate vag del Plan for SW consult postpartum  Madison Soto [facility] 05/20/2020 3:40 AM

## 2020-05-20 NOTE — Discharge Instructions (Signed)

## 2020-05-20 NOTE — Discharge Summary (Addendum)
Postpartum Discharge Summary  Date of Service updated 05/21/2020    Patient Name: Madison Soto DOB: 10-Aug-1995 MRN: [id number]  Date of admission: 05/19/2020 Delivery date:05/20/2020  Delivering provider: Randa Ngo  Date of discharge: 05/21/2020  Admitting diagnosis: Indication for care or intervention in labor or delivery [O75.9] Intrauterine pregnancy: [redacted]w[redacted]d    Secondary diagnosis:  Principal Problem:   Vaginal delivery Active Problems:   Trichomonal vaginitis during pregnancy in third trimester   Indication for care or intervention in labor or delivery   Marijuana use  Additional problems: as noted above    Discharge diagnosis: Term Pregnancy Delivered                                              Post partum procedures: none Augmentation: AROM and Pitocin Complications: None  Hospital course: Induction of Labor With Vaginal Delivery   25y.o. G2P1001 at 464w0das admitted to the hospital 05/19/2020 for induction of labor.  Indication for induction:  non-reactive NST, post-dates .  Patient had an uncomplicated labor course as follows: Membrane Rupture Time/Date: 9:02 AM ,05/20/2020   Delivery Method:Vaginal, Spontaneous  Episiotomy: None  Lacerations:  Periurethral  Details of delivery can be found in separate delivery note. Patient had a routine postpartum course. She is ambulating, tolerating a regular diet, and voiding without difficulty. Patient is discharged home 05/21/20.  Newborn Data: Birth date:05/20/2020  Birth time:11:50 AM  Gender:Female  Living status:Living  Apgars:7 ,9  Weight:3.374 kg   Magnesium Sulfate received: No BMZ received: No Rhophylac:N/A MMR:N/A T-DaP: offered prior to discharge Flu: offered prior to discharge Transfusion:No  Physical exam  Vitals:   05/20/20 1330 05/20/20 1430 05/20/20 1812 05/20/20 2300  BP: (!) 117/58 104/63 121/60 112/62  Pulse: 60 (!) 57 (!) 54 (!) 58  Resp: '18 18 18 18  ' Temp: 98.4 F (36.9 C) 98.4 F (36.9  C) 98.2 F (36.8 C) 98.1 F (36.7 C)  TempSrc: Oral Axillary Oral Oral  SpO2: 98% 99% 99% 99%  Weight:      Height:       General: alert, cooperative and no distress Lochia: appropriate Uterine Fundus: firm Incision: N/A DVT Evaluation: No evidence of DVT seen on physical exam. Labs: Trichomonas TOC negative Lab Results  Component Value Date   WBC 7.9 05/19/2020   HGB 10.4 (L) 05/19/2020   HCT 31.5 (L) 05/19/2020   MCV 89.2 05/19/2020   PLT 284 05/19/2020   CMP Latest Ref Rng & Units 10/14/2017  Glucose 70 - 99 mg/dL 86  BUN 6 - 20 mg/dL 12  Creatinine 0.44 - 1.00 mg/dL 0.87  Sodium 135 - 145 mmol/L 138  Potassium 3.5 - 5.1 mmol/L 3.5  Chloride 98 - 111 mmol/L 106  CO2 22 - 32 mmol/L 24  Calcium 8.9 - 10.3 mg/dL 9.1  Total Protein 6.5 - 8.1 g/dL 8.0  Total Bilirubin 0.3 - 1.2 mg/dL 0.9  Alkaline Phos 38 - 126 U/L 68  AST 15 - 41 U/L 14(L)  ALT 0 - 44 U/L 10   Edinburgh Score: Edinburgh Postnatal Depression Scale Screening Tool 09/05/2016  I have been able to laugh and see the funny side of things. 0  I have looked forward with enjoyment to things. 0  I have blamed myself unnecessarily when things went wrong. 0  I have been anxious or worried  for no good reason. 0  I have felt scared or panicky for no good reason. 0  Things have been getting on top of me. 0  I have been so unhappy that I have had difficulty sleeping. 0  I have felt sad or miserable. 0  I have been so unhappy that I have been crying. 0  The thought of harming myself has occurred to me. 0  Edinburgh Postnatal Depression Scale Total 0     After visit meds:  Allergies as of 05/21/2020   No Known Allergies      Medication List     STOP taking these medications    Gojji Weight Scale Misc   Prenate Mini 18-0.6-0.4-350 MG Caps       TAKE these medications    acetaminophen 325 MG tablet Commonly known as: Tylenol Take 2 tablets (650 mg total) by mouth every 6 (six) hours.   Blood  Pressure Monitor Automat Devi 1 Device by Does not apply route daily. Automatic blood pressure cuff regular size. To monitor blood pressure regularly at home. ICD-10 code:Z34.90   [place] 1 Units by Does not apply route daily as needed.   ibuprofen 600 MG tablet Commonly known as: ADVIL Take 1 tablet (600 mg total) by mouth every 6 (six) hours.         Discharge home in stable condition Infant Feeding: Bottle Infant Disposition:home with mother Discharge instruction: per After Visit Summary and Postpartum booklet. Activity: Advance as tolerated. Pelvic rest for 6 weeks.  Diet: routine diet Future Appointments: Future Appointments  Date Time Provider [place]  07/01/2020  1:50 PM Laury Deep, CNM [facility] None   Follow up Visit:  Message sent to [facility] by Dr. Astrid Drafts  Please schedule this patient for a In person postpartum visit in 6 weeks with the following provider: Any provider. Additional Postpartum F/U: none   Low risk pregnancy complicated by:  trichomonas, late [facility], marijuana use Delivery mode:  Vaginal, Spontaneous  Anticipated Birth Control:  PP Depo given   05/21/2020 Christin Fudge, CNM  Attestation of Attending Supervision of Advanced Practice Provider (PA/CNM/NP): Evaluation and management procedures were performed by the Advanced Practice Provider under my supervision and collaboration.  I have reviewed the Advanced Practice Provider's note and chart, and I agree with the management and plan. I have also made any necessary editorial changes.   Annalee Genta, DO Attending [place], [facility] for [facility], [facility] 05/24/2020 1:22 PM

## 2020-05-20 NOTE — Progress Notes (Signed)
LABOR PROGRESS NOTE  Madison Soto is a 25 y.o. G2P1001 at [redacted]w[redacted]d admitted for IOL due to postdates and non-reactive NST.  Subjective: Patient somewhat uncomfortable with her contractions. Has received Fentanyl x6.  Objective: BP 125/66   Pulse 60   Temp 97.7 F (36.5 C) (Oral)   Resp 15   Ht 5\' 6"  (1.676 m)   Wt 76.4 kg   LMP 08/07/2019 (Within Days)   SpO2 98%   BMI 27.18 kg/m  or  Vitals:   05/20/20 0614 05/20/20 0650 05/20/20 0722 05/20/20 0906  BP: (!) 103/51 (!) 107/58 (!) 102/52 125/66  Pulse: (!) 58 (!) 59 63 60  Resp: 16 16 15    Temp: 98.2 F (36.8 C)   97.7 F (36.5 C)  TempSrc: Oral   Oral  SpO2:      Weight:      Height:        Dilation: 4 Effacement (%): 80 Cervical Position: Middle Station: -1,0 Presentation: Vertex Exam by:: Dr. FHT: baseline rate 130, moderate varibility, positive acels, no decels Toco: q1-3 min  Labs: Lab Results  Component Value Date   WBC 7.9 05/19/2020   HGB 10.4 (L) 05/19/2020   HCT 31.5 (L) 05/19/2020   MCV 89.2 05/19/2020   PLT 284 05/19/2020    Patient Active Problem List   Diagnosis Date Noted  . Indication for care or intervention in labor or delivery 05/19/2020  . Marijuana use 05/19/2020  . Trichomonal vaginitis during pregnancy in third trimester 03/18/2020  . Supervision of other normal pregnancy, antepartum 03/04/2020    Assessment / Plan: 25 y.o. G2P1001 at [redacted]w[redacted]d here for IOL due to post-dates/non-reactive NST in the office.  IOL: Most recent cervical exam essentially unchanged from prior (4/80/-1). Currently on pitocin 10 mu/min. AROM performed with this check for clear fluid and IUPC placed. Will continue to titrate pit as appropriate. Fetal Wellbeing:  Cat I strip Pain Control: s/p IV Fentanyl x6. Discussed pain management strategies with patient. She does not desire epidural. Trich+: TOC ordered on admission Late PNC: established at 30 weeks. Postpartum SW consult THC+: postpartum SW  consult Anticipated MOD:  Vaginal  [redacted]w[redacted]d, MD PGY-1 [facility] 05/20/2020, 9:30 AM

## 2020-05-21 MED ORDER — ACETAMINOPHEN 325 MG PO TABS: 650.0000 mg | ORAL_TABLET | Freq: Four times a day (QID) | ORAL | Status: DC

## 2020-05-21 MED ORDER — IBUPROFEN 600 MG PO TABS: 600.0000 mg | ORAL_TABLET | Freq: Four times a day (QID) | ORAL | Status: DC

## 2020-05-21 NOTE — Clinical Social Work Maternal (Signed)
CLINICAL SOCIAL WORK MATERNAL/CHILD NOTE  Patient Details  Name: Madison Soto MRN: [id number] Date of Birth: 20-Mar-1995  Date:  05/21/2020  Clinical Social Worker Initiating Note:  Kathrin Greathouse, [place] Date/Time: Initiated:  05/21/20/1253     Child's Name:  Madison Soto   Biological Parents:  Mother,Father   Need for Interpreter:  None   Reason for Referral:  Late or No Prenatal Care ,Current Substance Use/Substance Use During Pregnancy    Address:  [place] [address]    Phone number:  [phone] (home)     Additional phone number:   Household Members/Support Persons (HM/SP):   Household Member/Support Person 1,Household Member/Support Person 2   HM/SP Name Relationship DOB or Age  HM/SP -1 Melvena Vink [employer] Other 04-11-1990 (32)  HM/SP -2 Sybel Standish Child 08-02-16 (4)  HM/SP -3        HM/SP -4        HM/SP -5        HM/SP -6        HM/SP -7        HM/SP -8          Natural Supports (not living in the home):  Extended Family   Professional Supports:     Employment: Full-time   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)  [place]      Pediatrician Fax Number:    Risk Factors/Current Problems:  Substance Use    Cognitive State:  Alert ,Able to Concentrate ,Insightful ,Linear Thinking    Mood/Affect:  Calm ,Bright    CSW Assessment: CSW met with the patient at bedside. Introduced role and  congratulated MOB and FOB Lashannon Bresnan). CSW observed MOB holding the infant and FOB at bedside.  CSW informed MOB of privacy/HIPPA policy and offered it. MOB agreeable for FOB to stay. CSW explain the reason for the visit-late prenatal care. CSW asked MOB how she feels since giving birth. MOB reports, "I am ok, a little sore." FOB praised MOB for not using and epidural or requiring stitches after birth. CSW provided praise as well. CSW asked MOB about her pregnancy. MOB reports, the pregnancy was fine, no complications.  CSW asked MOB about the later prenatal care. MOB reports she was working and could not make it to her appointments. MOB she eventually established care at the [facility] [employer] [facility] [place]. CSW asked MOB about her supports. MOB reports her supports are the FOB, aunts, sister other relatives.   CSW asked MOB about her household members. MOB reports she lives with her FOB and her four-year-old son. MOB reports she currently received WIC and food stamp services. MOB aware to call [facility] and the infant to the [facility]. CSW asked MOB about her mental health history. MOB denies mental health history. MOB denies postpartum depression during her first pregnancy.  CSW asked MOB if she is having thoughts of suicide or  homicide. MOB denies thoughts of suicide and homicide. CSW provided education regarding the baby blues period vs. perinatal mood disorders, discussed treatment and gave resources for mental health follow up if concerns arise.  CSW recommended MOB complete a self-evaluation during the postpartum time period using the New Mom Checklist from Postpartum Progress and encouraged MOB to contact a medical professional if symptoms are noted at any time. MOB receptive and report understanding. CSW provided review of Sudden Infant Death Syndrome (SIDS) precautions and informed no co-sleeping with the infant. MOB reports understanding.   CSW asked MOB about her substance use history. MOB  denied substances (marijuana) use during her pregnancy. CSW notified MOB that her urine tested positive for THC (Tetrahydrocannabinol). MOB then stated, yes, I used during my pregnancy because I was very nauseous. I was told that feeling nauseous is a symptom of withdrawal from marijuana. MOB reports the last time she used was about two weeks ago. CSW explain the drug screen policy to MOB. CSW explain, if there are late prenatal records, then the hospital will drug screen the infant via UDS and CDS. MOB informed the cord will be followed until the results are returned. CSW informed MOB if the infant test positive for any substances, then child protective services (CPS) will be notified. MOB asked if she had questions or concerns. MOB had no questions or concerns CSW asked MOB if she had any previous or opened CPS cases. MOB denied any other CPS case.   CSW asked MOB about items for the infant. MOB reports she has all items for the infant including a car seat and bassinet. MOB reports she chose for the infant to go to [facility]. CSW assessed MOB for additional needs.   CSW identifies no further need for intervention and no barriers to discharge at this time.  CSW Plan/Description:  No Further Intervention Required/No Barriers to Discharge,Perinatal Mood and Anxiety Disorder (PMADs) Education,Hospital Drug Screen Policy Information,Child Protective Service Report ,CSW Will Continue to Monitor Umbilical Cord Tissue Drug Screen Results and Make Report if Warranted,Sudden Infant Death Syndrome (SIDS) Education    Lia Hopping, LCSW 05/21/2020, 1:03 PM

## 2020-05-24 ENCOUNTER — Telehealth: Payer: Self-pay | Admitting: *Deleted

## 2020-05-24 NOTE — Telephone Encounter (Signed)
Transition Care Management Follow-up Telephone Call  Date of discharge and from where: 05/21/2020 - [facility]  How have you been since you were released from the hospital? "Okay"  Any questions or concerns? No  Items Reviewed:  Did the pt receive and understand the discharge instructions provided? Yes   Medications obtained and verified? Yes   Other? No   Any new allergies since your discharge? No   Dietary orders reviewed? No  Do you have support at home? Yes   Functional Questionnaire: (I = Independent and D = Dependent) ADLs: I  Bathing/Dressing- I  Meal Prep- I  Eating- I  Maintaining continence- I  Transferring/Ambulation- I  Managing Meds- I  Follow up appointments reviewed:   PCP Hospital f/u appt confirmed? No    Specialist Hospital f/u appt confirmed? Yes  Scheduled to see OBGYN on 07/01/2020 @ 1350.  Are transportation arrangements needed? No   If their condition worsens, is the pt aware to call PCP or go to the Emergency Dept.? Yes  Was the patient provided with contact information for the PCP's office or ED? Yes  Was to pt encouraged to call back with questions or concerns? Yes

## 2020-07-01 ENCOUNTER — Ambulatory Visit: Payer: Medicaid Other | Admitting: Obstetrics and Gynecology

## 2020-08-03 ENCOUNTER — Ambulatory Visit (HOSPITAL_COMMUNITY)
Admission: EM | Admit: 2020-08-03 | Discharge: 2020-08-03 | Disposition: A | Discharge status: Home / Self Care | Payer: Medicaid Other | Attending: Family Medicine | Admitting: Family Medicine

## 2020-08-03 ENCOUNTER — Other Ambulatory Visit: Payer: Self-pay

## 2020-08-03 ENCOUNTER — Encounter (HOSPITAL_COMMUNITY): Payer: Self-pay

## 2020-08-03 DIAGNOSIS — L02214 Cutaneous abscess of groin: Secondary | ICD-10-CM

## 2020-08-03 MED ORDER — CEPHALEXIN 500 MG PO CAPS: 500.0000 mg | ORAL_CAPSULE | Freq: Two times a day (BID) | ORAL | 0 refills | Status: DC

## 2020-08-03 MED ORDER — HIBICLENS 4 % EX LIQD: Freq: Every day | CUTANEOUS | 0 refills | Status: DC | PRN

## 2020-08-03 NOTE — ED Triage Notes (Signed)
Pt presents with abscess in left groin area X 2 days.

## 2020-08-05 NOTE — ED Provider Notes (Signed)
[facility]    CSN: [id number] Arrival date & time: 08/03/20  1918      History   Chief Complaint Chief Complaint  Patient presents with   Abscess    HPI Madison Soto is a 25 y.o. female.   Patient presenting today with 2-day history of acutely worsening left labial abscess.  States she has had this issue multiple times in the past and has had to get it lanced several times.  Her pain is an 8-9 out of 10 at this time.  No active drainage to the area.  Has been sitting in a warm bath without relief.  Denies fever, chills, active drainage from the area, vaginal discharge or bleeding.    Past Medical History:  Diagnosis Date   Asthma    childhood. patient was told she "grew out of it'   Medical history non-contributory     Patient Active Problem List   Diagnosis Date Noted   Indication for care or intervention in labor or delivery 05/19/2020   Marijuana use 05/19/2020   Trichomonal vaginitis during pregnancy in third trimester 03/18/2020   Supervision of other normal pregnancy, antepartum 03/04/2020   Vaginal delivery 08/03/2016    Past Surgical History:  Procedure Laterality Date   NO PAST SURGERIES      OB History     Gravida  2   Para  2   Term  2   Preterm      AB      Living  2      SAB      IAB      Ectopic      Multiple  0   Live Births  2            Home Medications    Prior to Admission medications   Medication Sig Start Date End Date Taking? Authorizing Provider  cephALEXin (KEFLEX) 500 MG capsule Take 1 capsule (500 mg total) by mouth 2 (two) times daily. 08/03/20  Yes Particia Nearing, PA-C  chlorhexidine (HIBICLENS) 4 % external liquid Apply topically daily as needed. 08/03/20  Yes Particia Nearing, PA-C  acetaminophen (TYLENOL) 325 MG tablet Take 2 tablets (650 mg total) by mouth every 6 (six) hours. 05/21/20   Maury Dus, MD  Blood Pressure Monitoring (BLOOD PRESSURE MONITOR AUTOMAT) DEVI 1 Device  by Does not apply route daily. Automatic blood pressure cuff regular size. To monitor blood pressure regularly at home. ICD-10 code:Z34.90 03/18/20   Raelyn Mora, CNM  Elastic Bandages & Supports (COMFORT FIT MATERNITY SUPP SM) MISC 1 Units by Does not apply route daily as needed. 03/04/20   Raelyn Mora, CNM  ibuprofen (ADVIL) 600 MG tablet Take 1 tablet (600 mg total) by mouth every 6 (six) hours. 05/21/20   Maury Dus, MD    Family History Family History  Problem Relation Age of Onset   Hypertension Paternal Grandmother    Hypertension Paternal Grandfather     Social History Social History   Tobacco Use   Smoking status: Former    Pack years: 0.00   Smokeless tobacco: Never  Vaping Use   Vaping Use: Never used  Substance Use Topics   Alcohol use: No   Drug use: No     Allergies   Patient has no known allergies.   Review of Systems Review of Systems Per HPI  Physical Exam Triage Vital Signs ED Triage Vitals [08/03/20 1956]  Enc Vitals Group     BP 125/67  Pulse Rate 78     Resp 18     Temp 98.1 F (36.7 C)     Temp Source Oral     SpO2 100 %     Weight      Height      Head Circumference      Peak Flow      Pain Score 9     Pain Loc      Pain Edu?      Excl. in GC?    No data found.  Updated Vital Signs BP 125/67 (BP Location: Right Arm)   Pulse 78   Temp 98.1 F (36.7 C) (Oral)   Resp 18   LMP 07/30/2020   SpO2 100%   Visual Acuity Right Eye Distance:   Left Eye Distance:   Bilateral Distance:    Right Eye Near:   Left Eye Near:    Bilateral Near:     Physical Exam Vitals and nursing note reviewed. Exam conducted with a chaperone present.  Constitutional:      Appearance: Normal appearance. She is not ill-appearing.  HENT:     Head: Atraumatic.  Eyes:     Extraocular Movements: Extraocular movements intact.     Conjunctiva/sclera: Conjunctivae normal.  Cardiovascular:     Rate and Rhythm: Normal rate and regular  rhythm.     Heart sounds: Normal heart sounds.  Pulmonary:     Effort: Pulmonary effort is normal.     Breath sounds: Normal breath sounds.  Abdominal:     General: Bowel sounds are normal. There is no distension.     Palpations: Abdomen is soft.     Tenderness: There is no abdominal tenderness. There is no guarding.  Genitourinary:    Comments: 3 cm x 1 cm abscess extending from left upper labia to groin region.  Mildly fluctuant, no induration.  Entire area erythematous.  Significantly tender to palpation Musculoskeletal:        General: Normal range of motion.     Cervical back: Normal range of motion and neck supple.  Skin:    General: Skin is warm and dry.  Neurological:     Mental Status: She is alert and oriented to person, place, and time.  Psychiatric:        Mood and Affect: Mood normal.        Thought Content: Thought content normal.        Judgment: Judgment normal.     UC Treatments / Results  Labs (all labs ordered are listed, but only abnormal results are displayed) Labs Reviewed - No data to display  EKG   Radiology No results found.  Procedures Incision and Drainage  Date/Time: 08/03/2020 8:50 PM Performed by: Particia Nearing, PA-C Authorized by: Particia Nearing, PA-C   Consent:    Consent obtained:  Verbal   Consent given by:  Patient   Risks, benefits, and alternatives were discussed: yes     Risks discussed:  Bleeding, incomplete drainage, pain and infection   Alternatives discussed:  Alternative treatment Universal protocol:    Procedure explained and questions answered to patient or proxy's satisfaction: yes     Relevant documents present and verified: yes     Site/side marked: yes     Immediately prior to procedure, a time out was called: yes     Patient identity confirmed:  Verbally with patient Location:    Type:  Abscess   Location:  Anogenital   Anogenital location:  Vulva Pre-procedure details:    Skin preparation:   Chlorhexidine with alcohol Sedation:    Sedation type:  None Anesthesia:    Anesthesia method:  Local infiltration   Local anesthetic:  Lidocaine 2% w/o epi Procedure type:    Complexity:  Simple Procedure details:    Ultrasound guidance: no     Needle aspiration: no     Incision types:  Stab incision   Incision depth:  Dermal   Wound management:  Probed and deloculated   Drainage:  Purulent and bloody   Drainage amount:  Copious   Wound treatment:  Wound left open   Packing materials:  None Post-procedure details:    Procedure completion:  Tolerated (No immediate complications but significant pain when pressure applied during procedure) (including critical care time)  Medications Ordered in UC Medications - No data to display  Initial Impression / Assessment and Plan / UC Course  I have reviewed the triage vital signs and the nursing notes.  Pertinent labs & imaging results that were available during my care of the patient were reviewed by me and considered in my medical decision making (see chart for details).     I&D performed today without complication, will start Keflex and Hibiclens in addition to warm Epsom salt soaks and warm compresses.  Over-the-counter pain relievers as needed, close GYN follow-up for recheck and further management particularly given the recurrent nature of the issue.  Final Clinical Impressions(s) / UC Diagnoses   Final diagnoses:  Abscess of groin, left   Discharge Instructions   None    ED Prescriptions     Medication Sig Dispense Auth. Provider   cephALEXin (KEFLEX) 500 MG capsule Take 1 capsule (500 mg total) by mouth 2 (two) times daily. 14 capsule Particia Nearing, [place]   chlorhexidine (HIBICLENS) 4 % external liquid Apply topically daily as needed. 120 mL Particia Nearing, [place]      PDMP not reviewed this encounter.   Particia Nearing, [place] 08/05/20 1615

## 2021-02-27 NOTE — L&D Delivery Note (Cosign Needed)
OB/GYN Faculty Practice Delivery Note  Madison Soto is a 26 y.o. G3P2002 s/p IVD at [redacted]w[redacted]d. She was admitted for eIOL.   ROM: 3h 55m with clear fluid GBS Status: negative Maximum Maternal Temperature: 98.4  Labor Progress: uncomplicated  Delivery Date/Time: 10/22/21 Delivery: Called to room and patient was complete and pushing. Head delivered OA, with restitution to LOT. No nuchal cord present. Shoulder and body delivered in usual fashion. Infant with spontaneous cry, placed on mother's abdomen, dried and stimulated. Cord clamped x 2 after 1-minute delay, and cut by FOB under my direct supervision. Cord blood drawn. Placenta delivered spontaneously with gentle cord traction. Fundus firm with massage and Pitocin. Labia, perineum, vagina, and cervix were inspected, found intact. No need for repair.   Placenta: complete, three vessel cord appreciated Complications: None Lacerations: None EBL: 150 mL Analgesia: PRN IV fentanyl  Postpartum Planning Message to sent to schedule follow-up  Vaccines UTD  Infant: Female  APGARs 8/9  not yet weighed  Wyn Forster, MD PGY3 [facility], [facility]     Midwife attestation: I was gloved and present for delivery in its entirety and I agree with the above resident's note.  Sharen Counter, CNM 2:21 PM

## 2021-05-05 ENCOUNTER — Encounter (HOSPITAL_COMMUNITY): Payer: Self-pay

## 2021-05-05 ENCOUNTER — Ambulatory Visit (HOSPITAL_COMMUNITY)
Admission: EM | Admit: 2021-05-05 | Discharge: 2021-05-05 | Disposition: A | Discharge status: Home / Self Care | Payer: Medicaid Other | Attending: Family Medicine | Admitting: Family Medicine

## 2021-05-05 ENCOUNTER — Other Ambulatory Visit: Payer: Self-pay

## 2021-05-05 DIAGNOSIS — R1084 Generalized abdominal pain: Secondary | ICD-10-CM

## 2021-05-05 DIAGNOSIS — Z3201 Encounter for pregnancy test, result positive: Secondary | ICD-10-CM | POA: Diagnosis not present

## 2021-05-05 DIAGNOSIS — R110 Nausea: Secondary | ICD-10-CM

## 2021-05-05 DIAGNOSIS — Z3490 Encounter for supervision of normal pregnancy, unspecified, unspecified trimester: Secondary | ICD-10-CM

## 2021-05-05 LAB — POCT URINALYSIS DIPSTICK, ED / UC
Bilirubin Urine: NEGATIVE
Glucose, UA: NEGATIVE mg/dL
Hgb urine dipstick: NEGATIVE
Ketones, ur: NEGATIVE mg/dL
Leukocytes,Ua: NEGATIVE
Nitrite: NEGATIVE
Protein, ur: NEGATIVE mg/dL
Specific Gravity, Urine: 1.025 (ref 1.005–1.030)
Urobilinogen, UA: 0.2 mg/dL (ref 0.0–1.0)
pH: 6 (ref 5.0–8.0)

## 2021-05-05 LAB — POC URINE PREG, ED: Preg Test, Ur: POSITIVE — AB

## 2021-05-05 NOTE — Discharge Instructions (Signed)
You were seen today for abdominal pain with pregnancy.  Your pregnancy test was positive today, and your urine is otherwise normal.  I recommend you go to the [facility] from here for further evaluation given your abdominal pain.  ?

## 2021-05-05 NOTE — ED Provider Notes (Signed)
?[facility] ? ? ? ?CSN: [id number] ?Arrival date & time: 05/05/21  1136 ? ? ?  ? ?History   ?Chief Complaint ?Chief Complaint  ?Patient presents with  ? Possible Pregnancy  ? ? ?HPI ?Ellar Hakala is a 26 y.o. female.  ? ?Patient is here for abdominal pain.  Pain all in the hips/groin area.  + nausea, no vomiting.  ?No pain or burning with urination.  ?No vaginal d/c or irritation.  ?No risk for stds per se.  ?She is at least [redacted] weeks pregnant.  ?She just wants a positive test to be referred to ob/gyn.  ? ?She declines any std testing today.  ? ? ?Past Medical History:  ?Diagnosis Date  ? Asthma   ? childhood. patient was told she "grew out of it'  ? Medical history non-contributory   ? ? ?Patient Active Problem List  ? Diagnosis Date Noted  ? Indication for care or intervention in labor or delivery 05/19/2020  ? Marijuana use 05/19/2020  ? Trichomonal vaginitis during pregnancy in third trimester 03/18/2020  ? Supervision of other normal pregnancy, antepartum 03/04/2020  ? Vaginal delivery 08/03/2016  ? ? ?Past Surgical History:  ?Procedure Laterality Date  ? NO PAST SURGERIES    ? ? ?OB History   ? ? Gravida  ?2  ? Para  ?2  ? Term  ?2  ? Preterm  ?   ? AB  ?   ? Living  ?2  ?  ? ? SAB  ?   ? IAB  ?   ? Ectopic  ?   ? Multiple  ?0  ? Live Births  ?2  ?   ?  ?  ? ? ? ?Home Medications   ? ?Prior to Admission medications   ?Medication Sig Start Date End Date Taking? Authorizing Provider  ?acetaminophen (TYLENOL) 325 MG tablet Take 2 tablets (650 mg total) by mouth every 6 (six) hours. 05/21/20   Maury Dus, MD  ?Blood Pressure Monitoring (BLOOD PRESSURE MONITOR AUTOMAT) DEVI 1 Device by Does not apply route daily. Automatic blood pressure cuff regular size. To monitor blood pressure regularly at home. ICD-10 code:Z34.90 03/18/20   Raelyn Mora, CNM  ?cephALEXin (KEFLEX) 500 MG capsule Take 1 capsule (500 mg total) by mouth 2 (two) times daily. 08/03/20   Particia Nearing, PA-C  ?chlorhexidine  (HIBICLENS) 4 % external liquid Apply topically daily as needed. 08/03/20   Particia Nearing, PA-C  ?Elastic Bandages & Supports (COMFORT FIT MATERNITY SUPP SM) MISC 1 Units by Does not apply route daily as needed. 03/04/20   Raelyn Mora, CNM  ?ibuprofen (ADVIL) 600 MG tablet Take 1 tablet (600 mg total) by mouth every 6 (six) hours. 05/21/20   Maury Dus, MD  ? ? ?Family History ?Family History  ?Problem Relation Age of Onset  ? Hypertension Paternal Grandmother   ? Hypertension Paternal Grandfather   ? ? ?Social History ?Social History  ? ?Tobacco Use  ? Smoking status: Former  ? Smokeless tobacco: Never  ?Vaping Use  ? Vaping Use: Never used  ?Substance Use Topics  ? Alcohol use: No  ? Drug use: No  ? ? ? ?Allergies   ?Patient has no known allergies. ? ? ?Review of Systems ?Review of Systems  ?Constitutional: Negative.   ?HENT: Negative.    ?Respiratory: Negative.    ?Cardiovascular: Negative.   ?Gastrointestinal:  Positive for abdominal pain.  ? ? ?Physical Exam ?Triage Vital Signs ?ED Triage Vitals  ?  Enc Vitals Group  ?   BP 05/05/21 1241 103/61  ?   Pulse Rate 05/05/21 1241 79  ?   Resp 05/05/21 1241 18  ?   Temp 05/05/21 1241 98.4 ?F (36.9 ?C)  ?   Temp Source 05/05/21 1241 Oral  ?   SpO2 05/05/21 1241 98 %  ?   Weight --   ?   Height --   ?   Head Circumference --   ?   Peak Flow --   ?   Pain Score 05/05/21 1240 5  ?   Pain Loc --   ?   Pain Edu? --   ?   Excl. in GC? --   ? ?No data found. ? ?Updated Vital Signs ?BP 103/61 (BP Location: Left Arm)   Pulse 79   Temp 98.4 ?F (36.9 ?C) (Oral)   Resp 18   LMP 01/16/2021   SpO2 98%  ? ?Visual Acuity ?Right Eye Distance:   ?Left Eye Distance:   ?Bilateral Distance:   ? ?Right Eye Near:   ?Left Eye Near:    ?Bilateral Near:    ? ?Physical Exam ?Constitutional:   ?   Appearance: Normal appearance.  ?Cardiovascular:  ?   Rate and Rhythm: Normal rate and regular rhythm.  ?Pulmonary:  ?   Effort: Pulmonary effort is normal.  ?   Breath sounds: Normal  breath sounds.  ?Abdominal:  ?   Palpations: Abdomen is soft.  ?   Tenderness: There is abdominal tenderness.  ?   Comments: Across the lower abdomen;  no guarding or rebound noted  ?Neurological:  ?   Mental Status: She is alert.  ? ? ? ?UC Treatments / Results  ?Labs ?(all labs ordered are listed, but only abnormal results are displayed) ?Labs Reviewed  ?POC URINE PREG, ED - Abnormal; Notable for the following components:  ?    Result Value  ? Preg Test, Ur POSITIVE (*)   ? All other components within normal limits  ?POCT URINALYSIS DIPSTICK, ED / UC  ? ? ?EKG ? ? ?Radiology ?No results found. ? ?Procedures ?Procedures (including critical care time) ? ?Medications Ordered in UC ?Medications - No data to display ? ?Initial Impression / Assessment and Plan / UC Course  ?I have reviewed the triage vital signs and the nursing notes. ? ?Pertinent labs & imaging results that were available during my care of the patient were reviewed by me and considered in my medical decision making (see chart for details). ? ?Patient is here for abdominal pain while pregnancy.  Urine preg test positive today.  Urine otherwise normal.  I [facility].  She is agreeable and will go there now.  ?  ?Final Clinical Impressions(s) / UC Diagnoses  ? ?Final diagnoses:  ?Pregnancy, unspecified gestational age  ?Nausea  ?Generalized abdominal pain  ? ? ? ?Discharge Instructions   ? ?  ?You were seen today for abdominal pain with pregnancy.  Your pregnancy test was positive today, and your urine is otherwise normal.  I recommend you go to the [facility] from here for further evaluation given your abdominal pain.  ? ? ? ?ED Prescriptions   ?None ?  ? ?PDMP not reviewed this encounter. ?  Jannifer Franklin, MD ?05/05/21 1319 ? ?

## 2021-05-05 NOTE — ED Triage Notes (Addendum)
Patient states she is pregnant (about 15 weeks), has been having nausea and some abd cramping for 2 days. Patient states she just needs a pregnancy test.  ? ?LMP: 01/16/2021 ? ?

## 2021-05-06 ENCOUNTER — Inpatient Hospital Stay (HOSPITAL_COMMUNITY)
Admission: AD | Admit: 2021-05-06 | Discharge: 2021-05-06 | Discharge status: Against Medical Advice | Payer: Medicaid Other | Attending: Obstetrics and Gynecology | Admitting: Obstetrics and Gynecology

## 2021-05-06 ENCOUNTER — Encounter (HOSPITAL_COMMUNITY): Payer: Self-pay

## 2021-05-06 NOTE — MAU Note (Signed)
Pt wanting to establish care, planning to go to [facility] on 3rd... will call over there.  Has decided to proceed with that. (Per report from registration)Informed  by registration that she may return at any time. ?

## 2021-05-18 ENCOUNTER — Telehealth (INDEPENDENT_AMBULATORY_CARE_PROVIDER_SITE_OTHER): Payer: Medicaid Other

## 2021-05-18 DIAGNOSIS — Z3A00 Weeks of gestation of pregnancy not specified: Secondary | ICD-10-CM

## 2021-05-18 DIAGNOSIS — Z3480 Encounter for supervision of other normal pregnancy, unspecified trimester: Secondary | ICD-10-CM

## 2021-05-18 MED ORDER — PRENATAL PLUS 27-1 MG PO TABS: 1.0000 | ORAL_TABLET | Freq: Every day | ORAL | 11 refills | Status: DC

## 2021-05-18 NOTE — Progress Notes (Signed)
New OB Intake ? ?I connected with  Madison Soto on 05/18/21 at  1:15 PM EDT by MyChart Video Visit and verified that I am speaking with the correct person using two identifiers. Nurse is located at [facility] and pt is located at [employer] ?. ? ?I discussed the limitations, risks, security and privacy concerns of performing an evaluation and management service by telephone and the availability of in person appointments. I also discussed with the patient that there may be a patient responsible charge related to this service. The patient expressed understanding and agreed to proceed. ? ?I explained I am completing New OB Intake today. We discussed her EDD of 10/23/21 that is based on LMP of 01/16/21. Pt is G3/P2. I reviewed her allergies, medications, Medical/Surgical/OB history, and appropriate screenings. I informed her of [facility] services. Based on history, this is a/an  pregnancy uncomplicated .  ? ?Patient Active Problem List  ? Diagnosis Date Noted  ? Indication for care or intervention in labor or delivery 05/19/2020  ? Marijuana use 05/19/2020  ? Trichomonal vaginitis during pregnancy in third trimester 03/18/2020  ? Supervision of other normal pregnancy, antepartum 03/04/2020  ? Vaginal delivery 08/03/2016  ? ? ?Concerns addressed today ? ?Delivery Plans:  ?Plans to deliver at [facility].  ? ?MyChart/Babyscripts ?MyChart access verified. I explained pt will have some visits in office and some virtually. Babyscripts instructions given and order placed. Patient verifies receipt of registration text/e-mail. Account successfully created and app downloaded. ? ?Blood Pressure Cuff  ?Pt has own BP Cuff, Explained after first prenatal appt pt will check weekly and document in Babyscripts. ? ?Weight scale: Patient does / does not  have weight scale. Weight scale ordered for patient to pick up from [employer].  ? ?Anatomy US ?Explained first scheduled [place] will be around 19 weeks. Anatomy US scheduled for 06/17/21 at 08:30.  Pt notified to arrive at 0815. ?Scheduled AFP lab only appointment if CenteringPregnancy pt for same day as anatomy US.  ? ?Labs ?Discussed Avelina Laine genetic screening with patient. Would like both Panorama and Horizon drawn at new OB visit.Also if interested in genetic testing, tell patient she will need AFP 15-21 weeks to complete genetic testing .Routine prenatal labs needed. ? ?Covid Vaccine ?Patient has covid vaccine.  ? ?Is patient a CenteringPregnancy candidate? Declined work sched. "Centering Patient" indicated on sticky note ?  ?Is patient a Mom+Baby Combined Care candidate? Not a candidate   Scheduled with Mom+Baby provider  ?  ?Is patient interested in [place]? No  "Interested in [employer] - Schedule next visit with CNM" on sticky note ? ?Informed patient of [facility] website  and placed link in her AVS.  ? ?Social Determinants of Health ?Food Insecurity: Patient denies food insecurity. ?WIC Referral: Patient is interested in referral to [facility].  ?Transportation: Patient denies transportation needs. ?Childcare: Discussed no children allowed at ultrasound appointments. Offered childcare services; patient declines childcare services at this time. ? ?Send link to Pregnancy Navigators ? ? ?Placed OB Box on problem list and updated ? ?First visit review ?I reviewed new OB appt with pt. I explained she will have a pelvic exam, ob bloodwork with genetic screening, and PAP smear. Explained pt will be seen by Dr. Adrian Blackwater at first visit; encounter routed to appropriate provider. Explained that patient will be seen by pregnancy navigator following visit with provider. [facility] information placed in AVS.  ? ?Henrietta Dine, CMA ?05/18/2021  1:30 PM  ?

## 2021-05-18 NOTE — Progress Notes (Signed)
Chart reviewed - agree with CMA/RN documentation.  ° °

## 2021-05-18 NOTE — Patient Instructions (Signed)

## 2021-06-01 ENCOUNTER — Ambulatory Visit (INDEPENDENT_AMBULATORY_CARE_PROVIDER_SITE_OTHER): Payer: Medicaid Other | Admitting: Family Medicine

## 2021-06-01 ENCOUNTER — Encounter: Payer: Self-pay | Admitting: Family Medicine

## 2021-06-01 ENCOUNTER — Other Ambulatory Visit (HOSPITAL_COMMUNITY)
Admission: RE | Admit: 2021-06-01 | Discharge: 2021-06-01 | Disposition: A | Discharge status: Home / Self Care | Payer: Medicaid Other | Source: Ambulatory Visit | Attending: Family Medicine | Admitting: Family Medicine

## 2021-06-01 DIAGNOSIS — Z3480 Encounter for supervision of other normal pregnancy, unspecified trimester: Secondary | ICD-10-CM

## 2021-06-01 DIAGNOSIS — Z3482 Encounter for supervision of other normal pregnancy, second trimester: Secondary | ICD-10-CM | POA: Diagnosis not present

## 2021-06-01 LAB — POCT URINALYSIS DIP (DEVICE)
Bilirubin Urine: NEGATIVE
Glucose, UA: NEGATIVE mg/dL
Hgb urine dipstick: NEGATIVE
Ketones, ur: NEGATIVE mg/dL
Leukocytes,Ua: NEGATIVE
Nitrite: NEGATIVE
Protein, ur: NEGATIVE mg/dL
Specific Gravity, Urine: >=1.03 (ref 1.005–1.030)
Urobilinogen, UA: 0.2 mg/dL (ref 0.0–1.0)
pH: 6 (ref 5.0–8.0)

## 2021-06-01 NOTE — Progress Notes (Signed)
?Subjective:  ?Madison Soto is a [id number] [redacted]w[redacted]d being seen today for her first obstetrical visit.  Her obstetrical history is significant for  2 prior vaginal deliveries . Patient is low risk. No medical complications Patient does not intend to breast feed. Pregnancy history fully reviewed. ? ?Patient reports no complaints. ? ?BP (!) 112/52   Pulse 80   Wt 142 lb 1.6 oz (64.5 kg)   LMP 01/16/2021   BMI 22.94 kg/m?  ? ?HISTORY: ?OB History  ?Gravida Para Term Preterm AB Living  ?3 2 2     2   ?SAB IAB Ectopic Multiple Live Births  ?      0 2  ?  ?# Outcome Date GA Lbr Len/2nd Weight Sex Delivery Anes PTL Lv  ?3 Current           ?2 Term 05/20/20 [redacted]w[redacted]d 01:00 / 00:05 7 lb 7 oz (3.374 kg) M Vag-Spont None  LIV  ?   Birth Comments: WNL  ?1 Term 08/02/16 [redacted]w[redacted]d 06:55 / 00:23 7 lb 6.5 oz (3.359 kg) M Vag-Spont EPI  LIV  ? ? ?Past Medical History:  ?Diagnosis Date  ? Asthma   ? childhood. patient was told she "grew out of it'  ? Medical history non-contributory   ? ? ?Past Surgical History:  ?Procedure Laterality Date  ? NO PAST SURGERIES    ? ? ?Family History  ?Problem Relation Age of Onset  ? Hypertension Paternal Grandmother   ? Hypertension Paternal Grandfather   ? ? ? ?Exam  ?BP (!) 112/52   Pulse 80   Wt 142 lb 1.6 oz (64.5 kg)   LMP 01/16/2021   BMI 22.94 kg/m?  ? ?Chaperone present during exam ? ?CONSTITUTIONAL: Well-developed, well-nourished female in no acute distress.  ?HENT:  Normocephalic, atraumatic, External right and left ear normal. Oropharynx is clear and moist ?EYES: Conjunctivae and EOM are normal. Pupils are equal, round, and reactive to light. No scleral icterus.  ?NECK: Normal range of motion, supple, no masses.  Normal thyroid.  ?CARDIOVASCULAR: Normal heart rate noted, regular rhythm ?RESPIRATORY: Clear to auscultation bilaterally. Effort and breath sounds normal, no problems with respiration noted. ?BREASTS: declined ?ABDOMEN: Soft, normal bowel sounds, no distention noted.  No tenderness,  rebound or guarding.  ?PELVIC: declined ?MUSCULOSKELETAL: Normal range of motion. No tenderness.  No cyanosis, clubbing, or edema.  2+ distal pulses. ?SKIN: Skin is warm and dry. No rash noted. Not diaphoretic. No erythema. No pallor. ?NEUROLOGIC: Alert and oriented to person, place, and time. Normal reflexes, muscle tone coordination. No cranial nerve deficit noted. ?PSYCHIATRIC: Normal mood and affect. Normal behavior. Normal judgment and thought content. ? ?  ?Assessment:  ? ? Pregnancy: 01/18/2021 ?Patient Active Problem List  ? Diagnosis Date Noted  ? Supervision of other normal pregnancy, antepartum 05/18/2021  ? Indication for care or intervention in labor or delivery 05/19/2020  ? Marijuana use 05/19/2020  ? Vaginal delivery 08/03/2016  ? ? ?  ?Plan:  ? ?1. Supervision of other normal pregnancy, antepartum ?Initial labs obtained ?Continue prenatal vitamins ?Reviewed n/v relief measures and warning s/s to report ?Reviewed recommended weight gain based on pre-gravid BMI ?Encouraged well-balanced diet ?Genetic & carrier screening discussed: requests Panorama,  ?Ultrasound discussed; fetal survey: requested ?CCNC completed> form faxed if has or is planning to apply for medicaid ?The nature of [place] [facility] with multiple MDs and other Advanced Practice Providers was explained to patient; also emphasized that fellows, residents, and students are  part of our team. ? ?No indication for GDM screening or ASA. ? ? ?Problem list reviewed and updated. ?75% of 30 min visit spent on counseling and coordination of care.  ?  ? ?Levie Heritage ?06/01/2021 ?

## 2021-06-02 LAB — GC/CHLAMYDIA PROBE AMP (~~LOC~~) NOT AT ARMC
Chlamydia: NEGATIVE
Comment: NEGATIVE
Comment: NORMAL
Neisseria Gonorrhea: NEGATIVE

## 2021-06-04 LAB — PREGNANCY, INITIAL SCREEN
Antibody Screen: NEGATIVE
Basophils Absolute: 0 10*3/uL (ref 0.0–0.2)
Basos: 0 %
Bilirubin, UA: NEGATIVE
Chlamydia trachomatis, NAA: NEGATIVE
EOS (ABSOLUTE): 0.2 10*3/uL (ref 0.0–0.4)
Eos: 3 %
Glucose, UA: NEGATIVE
HCV Ab: NONREACTIVE
HIV Screen 4th Generation wRfx: NONREACTIVE
Hematocrit: 33.7 % — ABNORMAL LOW (ref 34.0–46.6)
Hemoglobin: 11.2 g/dL (ref 11.1–15.9)
Hepatitis B Surface Ag: NEGATIVE
Immature Grans (Abs): 0 10*3/uL (ref 0.0–0.1)
Immature Granulocytes: 0 %
Ketones, UA: NEGATIVE
Leukocytes,UA: NEGATIVE
Lymphocytes Absolute: 2.7 10*3/uL (ref 0.7–3.1)
Lymphs: 29 %
MCH: 29.8 pg (ref 26.6–33.0)
MCHC: 33.2 g/dL (ref 31.5–35.7)
MCV: 90 fL (ref 79–97)
Monocytes Absolute: 0.7 10*3/uL (ref 0.1–0.9)
Monocytes: 7 %
Neisseria Gonorrhoeae by PCR: NEGATIVE
Neutrophils Absolute: 5.8 10*3/uL (ref 1.4–7.0)
Neutrophils: 61 %
Nitrite, UA: NEGATIVE
Platelets: 286 10*3/uL (ref 150–450)
Protein,UA: NEGATIVE
RBC, UA: NEGATIVE
RBC: 3.76 x10E6/uL — ABNORMAL LOW (ref 3.77–5.28)
RDW: 13.1 % (ref 11.7–15.4)
RPR Ser Ql: NONREACTIVE
Rh Factor: POSITIVE
Rubella Antibodies, IGG: 2.15 index (ref >0.99)
Specific Gravity, UA: 1.028 (ref 1.005–1.030)
Urobilinogen, Ur: 0.2 mg/dL (ref 0.2–1.0)
WBC: 9.4 10*3/uL (ref 3.4–10.8)
pH, UA: 6 (ref 5.0–7.5)

## 2021-06-04 LAB — MICROSCOPIC EXAMINATION
Bacteria, UA: NONE SEEN
Casts: NONE SEEN /lpf
RBC, Urine: NONE SEEN /hpf (ref 0–2)
WBC, UA: NONE SEEN /hpf (ref 0–5)

## 2021-06-04 LAB — HCV INTERPRETATION

## 2021-06-04 LAB — URINE CULTURE, OB REFLEX

## 2021-06-10 ENCOUNTER — Encounter: Payer: Self-pay | Admitting: *Deleted

## 2021-06-17 ENCOUNTER — Ambulatory Visit: Payer: Medicaid Other | Attending: Family Medicine

## 2021-06-17 ENCOUNTER — Other Ambulatory Visit: Payer: Self-pay | Admitting: *Deleted

## 2021-06-17 ENCOUNTER — Encounter: Payer: Self-pay | Admitting: *Deleted

## 2021-06-17 ENCOUNTER — Ambulatory Visit: Payer: Medicaid Other | Admitting: *Deleted

## 2021-06-17 VITALS — BP 115/51 | HR 76

## 2021-06-17 DIAGNOSIS — O0932 Supervision of pregnancy with insufficient antenatal care, second trimester: Secondary | ICD-10-CM | POA: Diagnosis not present

## 2021-06-17 DIAGNOSIS — F1210 Cannabis abuse, uncomplicated: Secondary | ICD-10-CM

## 2021-06-17 DIAGNOSIS — Z363 Encounter for antenatal screening for malformations: Secondary | ICD-10-CM | POA: Diagnosis not present

## 2021-06-17 DIAGNOSIS — Z3480 Encounter for supervision of other normal pregnancy, unspecified trimester: Secondary | ICD-10-CM

## 2021-06-17 DIAGNOSIS — Z3A21 21 weeks gestation of pregnancy: Secondary | ICD-10-CM | POA: Insufficient documentation

## 2021-06-17 DIAGNOSIS — O365990 Maternal care for other known or suspected poor fetal growth, unspecified trimester, not applicable or unspecified: Secondary | ICD-10-CM

## 2021-06-17 DIAGNOSIS — O09892 Supervision of other high risk pregnancies, second trimester: Secondary | ICD-10-CM | POA: Diagnosis present

## 2021-06-29 NOTE — Progress Notes (Signed)
? ?  PRENATAL VISIT NOTE ? ?Subjective:  ?Madison Soto is a 26 y.o. G3P2002 at [redacted]w[redacted]d being seen today for ongoing prenatal care.  She is currently monitored for the following issues for this low-risk pregnancy and has Vaginal delivery; Indication for care or intervention in labor or delivery; Marijuana use; and Supervision of other normal pregnancy, antepartum on their problem list. ? ?Patient reports  some carpal tunnel in her right hand (dominant hand) that she has noticed since she started a new type of work at her job. She also feels some bumps on her legs; they don't bother her or itch.  .  Contractions: Not present. Vag. Bleeding: None.  Movement: Present. Denies leaking of fluid.  ? ?The following portions of the patient's history were reviewed and updated as appropriate: allergies, current medications, past family history, past medical history, past social history, past surgical history and problem list.  ? ?Objective:  ? ?Vitals:  ? 06/30/21 1607  ?BP: (!) 102/55  ?Pulse: 85  ?Weight: 148 lb 3.2 oz (67.2 kg)  ? ? ?Fetal Status: Fetal Heart Rate (bpm): 150 Fundal Height: 24 cm Movement: Present    ? ?General:  Alert, oriented and cooperative. Patient is in no acute distress.  ?Skin: Skin is warm and dry. No rash noted.   ?Cardiovascular: Normal heart rate noted  ?Respiratory: Normal respiratory effort, no problems with respiration noted  ?Abdomen: Soft, gravid, appropriate for gestational age.  Pain/Pressure: Absent     ?Pelvic: Cervical exam deferred        ?Extremities: Normal range of motion.     ?Mental Status: Normal mood and affect. Normal behavior. Normal judgment and thought content.  ? ?Assessment and Plan:  ?Pregnancy: G3P2002 at [redacted]w[redacted]d ?1. [redacted] weeks gestation of pregnancy   ?2. Supervision of other normal pregnancy, antepartum   ?-reviewed instructions for next prenatal appointments for GTT ?-recommended exercises and supports for carpal tunnel ?-explained pathophysiology of pregnancy and causes  for varicose veins ? ?Preterm labor symptoms and general obstetric precautions including but not limited to vaginal bleeding, contractions, leaking of fluid and fetal movement were reviewed in detail with the patient. ?Please refer to After Visit Summary for other counseling recommendations.  ? ?Return in about 4 weeks (around 07/28/2021), or LROB and 2 hour GTT. ? ?Future Appointments  ?Date Time Provider Department Center  ?07/22/2021  7:45 AM [facility] NURSE [facility]  ?07/22/2021  8:00 AM [facility] US1 [facility]  ?08/01/2021  8:20 AM [facility] LAB [facility]  ?08/01/2021 10:35 AM  Bing, MD [facility]  ? ? ?Marylene Land, CNM ? ?

## 2021-06-30 ENCOUNTER — Encounter: Payer: Medicaid Other | Admitting: Student

## 2021-06-30 ENCOUNTER — Ambulatory Visit (INDEPENDENT_AMBULATORY_CARE_PROVIDER_SITE_OTHER): Payer: Medicaid Other | Admitting: Student

## 2021-06-30 VITALS — BP 102/55 | HR 85 | Wt 148.2 lb

## 2021-06-30 DIAGNOSIS — Z3480 Encounter for supervision of other normal pregnancy, unspecified trimester: Secondary | ICD-10-CM

## 2021-06-30 DIAGNOSIS — Z3A23 23 weeks gestation of pregnancy: Secondary | ICD-10-CM

## 2021-06-30 NOTE — Progress Notes (Signed)
Pt advised that she has a lots of big bumps on her inner thigh & numbness in her hand. ?

## 2021-07-22 ENCOUNTER — Ambulatory Visit: Payer: Medicaid Other | Admitting: *Deleted

## 2021-07-22 ENCOUNTER — Ambulatory Visit: Payer: Medicaid Other | Attending: Maternal & Fetal Medicine

## 2021-07-22 VITALS — BP 108/52 | HR 57

## 2021-07-22 DIAGNOSIS — D563 Thalassemia minor: Secondary | ICD-10-CM

## 2021-07-22 DIAGNOSIS — Z362 Encounter for other antenatal screening follow-up: Secondary | ICD-10-CM | POA: Diagnosis not present

## 2021-07-22 DIAGNOSIS — Z3A26 26 weeks gestation of pregnancy: Secondary | ICD-10-CM

## 2021-07-22 DIAGNOSIS — O0932 Supervision of pregnancy with insufficient antenatal care, second trimester: Secondary | ICD-10-CM | POA: Insufficient documentation

## 2021-07-22 DIAGNOSIS — O321XX0 Maternal care for breech presentation, not applicable or unspecified: Secondary | ICD-10-CM | POA: Insufficient documentation

## 2021-07-22 DIAGNOSIS — O285 Abnormal chromosomal and genetic finding on antenatal screening of mother: Secondary | ICD-10-CM | POA: Diagnosis not present

## 2021-07-22 DIAGNOSIS — Z148 Genetic carrier of other disease: Secondary | ICD-10-CM | POA: Diagnosis not present

## 2021-07-22 DIAGNOSIS — F1210 Cannabis abuse, uncomplicated: Secondary | ICD-10-CM | POA: Insufficient documentation

## 2021-07-22 DIAGNOSIS — O365920 Maternal care for other known or suspected poor fetal growth, second trimester, not applicable or unspecified: Secondary | ICD-10-CM

## 2021-07-22 DIAGNOSIS — O09892 Supervision of other high risk pregnancies, second trimester: Secondary | ICD-10-CM | POA: Diagnosis not present

## 2021-07-22 DIAGNOSIS — Z3480 Encounter for supervision of other normal pregnancy, unspecified trimester: Secondary | ICD-10-CM | POA: Insufficient documentation

## 2021-07-22 DIAGNOSIS — O365990 Maternal care for other known or suspected poor fetal growth, unspecified trimester, not applicable or unspecified: Secondary | ICD-10-CM | POA: Insufficient documentation

## 2021-08-01 ENCOUNTER — Other Ambulatory Visit: Payer: Self-pay | Admitting: *Deleted

## 2021-08-01 ENCOUNTER — Other Ambulatory Visit: Payer: Medicaid Other

## 2021-08-01 ENCOUNTER — Encounter: Payer: Medicaid Other | Admitting: Obstetrics and Gynecology

## 2021-08-01 DIAGNOSIS — Z3480 Encounter for supervision of other normal pregnancy, unspecified trimester: Secondary | ICD-10-CM

## 2021-10-01 LAB — OB RESULTS CONSOLE GBS: GBS: NEGATIVE

## 2021-10-09 ENCOUNTER — Encounter (HOSPITAL_COMMUNITY): Payer: Self-pay | Admitting: Obstetrics and Gynecology

## 2021-10-09 ENCOUNTER — Other Ambulatory Visit: Payer: Self-pay

## 2021-10-09 ENCOUNTER — Inpatient Hospital Stay (HOSPITAL_COMMUNITY)
Admission: AD | Admit: 2021-10-09 | Discharge: 2021-10-09 | Disposition: A | Discharge status: Home / Self Care | Payer: Medicaid Other | Attending: Obstetrics and Gynecology | Admitting: Obstetrics and Gynecology

## 2021-10-09 DIAGNOSIS — O0933 Supervision of pregnancy with insufficient antenatal care, third trimester: Secondary | ICD-10-CM | POA: Insufficient documentation

## 2021-10-09 DIAGNOSIS — R197 Diarrhea, unspecified: Secondary | ICD-10-CM | POA: Diagnosis not present

## 2021-10-09 DIAGNOSIS — R102 Pelvic and perineal pain: Secondary | ICD-10-CM | POA: Insufficient documentation

## 2021-10-09 DIAGNOSIS — Z3A38 38 weeks gestation of pregnancy: Secondary | ICD-10-CM | POA: Insufficient documentation

## 2021-10-09 DIAGNOSIS — O479 False labor, unspecified: Secondary | ICD-10-CM | POA: Diagnosis not present

## 2021-10-09 DIAGNOSIS — O26893 Other specified pregnancy related conditions, third trimester: Secondary | ICD-10-CM | POA: Insufficient documentation

## 2021-10-09 DIAGNOSIS — O99891 Other specified diseases and conditions complicating pregnancy: Secondary | ICD-10-CM | POA: Insufficient documentation

## 2021-10-09 DIAGNOSIS — O99513 Diseases of the respiratory system complicating pregnancy, third trimester: Secondary | ICD-10-CM | POA: Diagnosis not present

## 2021-10-09 DIAGNOSIS — O212 Late vomiting of pregnancy: Secondary | ICD-10-CM | POA: Insufficient documentation

## 2021-10-09 DIAGNOSIS — Z3685 Encounter for antenatal screening for Streptococcus B: Secondary | ICD-10-CM | POA: Insufficient documentation

## 2021-10-09 DIAGNOSIS — O471 False labor at or after 37 completed weeks of gestation: Secondary | ICD-10-CM | POA: Diagnosis not present

## 2021-10-09 LAB — WET PREP, GENITAL
Clue Cells Wet Prep HPF POC: NONE SEEN
Sperm: NONE SEEN
Trich, Wet Prep: NONE SEEN
WBC, Wet Prep HPF POC: >=10 — AB (ref <10)
Yeast Wet Prep HPF POC: NONE SEEN

## 2021-10-09 LAB — HEMOGLOBIN A1C
Hgb A1c MFr Bld: 5.1 % (ref 4.8–5.6)
Mean Plasma Glucose: 99.67 mg/dL

## 2021-10-09 LAB — CBC
HCT: 33.2 % — ABNORMAL LOW (ref 36.0–46.0)
Hemoglobin: 11.3 g/dL — ABNORMAL LOW (ref 12.0–15.0)
MCH: 30.2 pg (ref 26.0–34.0)
MCHC: 34 g/dL (ref 30.0–36.0)
MCV: 88.8 fL (ref 80.0–100.0)
Platelets: 254 10*3/uL (ref 150–400)
RBC: 3.74 MIL/uL — ABNORMAL LOW (ref 3.87–5.11)
RDW: 14 % (ref 11.5–15.5)
WBC: 8.8 10*3/uL (ref 4.0–10.5)
nRBC: 0 % (ref 0.0–0.2)

## 2021-10-09 LAB — URINALYSIS, ROUTINE W REFLEX MICROSCOPIC
Bilirubin Urine: NEGATIVE
Glucose, UA: NEGATIVE mg/dL
Hgb urine dipstick: NEGATIVE
Ketones, ur: NEGATIVE mg/dL
Leukocytes,Ua: NEGATIVE
Nitrite: NEGATIVE
Protein, ur: NEGATIVE mg/dL
Specific Gravity, Urine: 1.016 (ref 1.005–1.030)
pH: 6 (ref 5.0–8.0)

## 2021-10-09 LAB — HIV ANTIBODY (ROUTINE TESTING W REFLEX): HIV Screen 4th Generation wRfx: NONREACTIVE

## 2021-10-09 NOTE — MAU Provider Note (Signed)
History     [id number]  Arrival date and time: 10/09/21 1120    Chief Complaint  Patient presents with   Pelvic Pain     HPI Madison Soto is a 26 y.o. at [redacted]w[redacted]d who presents for pelvic pain. Symptoms started last night. Reports pelvic pain that occurs about 2 times per hour. Abdominal tightening when pain occurs. Vomited once this morning but denies nausea. Has had 3 loose stools today. No sick contacts.  Denies fever, dysuria, vaginal bleeding, vaginal discharge, or LOF. Reports good fetal movement. Hasn't been seen for prenatal care since 23 weeks.    OB History     Gravida  3   Para  2   Term  2   Preterm      AB      Living  2      SAB      IAB      Ectopic      Multiple  0   Live Births  2           Past Medical History:  Diagnosis Date   Asthma    childhood. patient was told she "grew out of it'    Past Surgical History:  Procedure Laterality Date   NO PAST SURGERIES      Family History  Problem Relation Age of Onset   Healthy Mother    Healthy Father    Hypertension Paternal Grandmother    Hypertension Paternal Grandfather     No Known Allergies  No current facility-administered medications on file prior to encounter.   Current Outpatient Medications on File Prior to Encounter  Medication Sig Dispense Refill   acetaminophen (TYLENOL) 325 MG tablet Take 2 tablets (650 mg total) by mouth every 6 (six) hours. (Patient not taking: Reported on 07/22/2021)     Blood Pressure Monitoring (BLOOD PRESSURE MONITOR AUTOMAT) DEVI 1 Device by Does not apply route daily. Automatic blood pressure cuff regular size. To monitor blood pressure regularly at home. ICD-10 code:Z34.90 (Patient not taking: Reported on 06/30/2021) 1 each 0   prenatal vitamin w/FE, FA (PRENATAL 1 + 1) 27-1 MG TABS tablet Take 1 tablet by mouth daily at 12 noon. 30 tablet 11     ROS Pertinent positives and negative per HPI, all others reviewed and negative  Physical Exam    BP (!) 104/56 (BP Location: Left Arm)   Pulse 86   Temp 98.2 F (36.8 C) (Oral)   Resp 19   Ht 5\' 5"  (1.651 m)   Wt 71 kg   LMP 01/16/2021   SpO2 99%   BMI 26.06 kg/m   Patient Vitals for the past 24 hrs:  BP Temp Temp src Pulse Resp SpO2 Height Weight  10/09/21 1300 (!) 104/56 98.2 F (36.8 C) Oral 86 -- -- -- --  10/09/21 1155 105/63 -- -- 73 -- -- -- --  10/09/21 1141 112/66 98.1 F (36.7 C) Oral 65 19 99 % -- --  10/09/21 1136 -- -- -- -- -- -- 5\' 5"  (1.651 m) 71 kg    Physical Exam Vitals and nursing note reviewed. Exam conducted with a chaperone present.  Constitutional:      General: She is not in acute distress.    Appearance: Normal appearance.  HENT:     Head: Normocephalic and atraumatic.  Eyes:     General: No scleral icterus.    Conjunctiva/sclera: Conjunctivae normal.  Pulmonary:     Effort: Pulmonary effort is normal. No respiratory  distress.  Abdominal:     Palpations: Abdomen is soft.     Tenderness: There is no abdominal tenderness.  Skin:    General: Skin is warm and dry.  Neurological:     Mental Status: She is alert.      Cervical Exam Dilation: 1 Effacement (%): Thick Cervical Position: Posterior Station: -3 Presentation: Vertex Exam by:: Burkley Dech np   FHT Baseline 130, moderate variability, 15x15 accels, no decels Toco: Q 15-20 mins Cat: 1  Labs Results for orders placed or performed during the hospital encounter of 10/09/21 (from the past 24 hour(s))  Urinalysis, Routine w reflex microscopic Urine, Clean Catch     Status: None   Collection Time: 10/09/21 11:45 AM  Result Value Ref Range   Color, Urine YELLOW YELLOW   APPearance CLEAR CLEAR   Specific Gravity, Urine 1.016 1.005 - 1.030   pH 6.0 5.0 - 8.0   Glucose, UA NEGATIVE NEGATIVE mg/dL   Hgb urine dipstick NEGATIVE NEGATIVE   Bilirubin Urine NEGATIVE NEGATIVE   Ketones, ur NEGATIVE NEGATIVE mg/dL   Protein, ur NEGATIVE NEGATIVE mg/dL   Nitrite NEGATIVE  NEGATIVE   Leukocytes,Ua NEGATIVE NEGATIVE  Wet prep, genital     Status: Abnormal   Collection Time: 10/09/21 12:32 PM   Specimen: Cervix  Result Value Ref Range   Yeast Wet Prep HPF POC NONE SEEN NONE SEEN   Trich, Wet Prep NONE SEEN NONE SEEN   Clue Cells Wet Prep HPF POC NONE SEEN NONE SEEN   WBC, Wet Prep HPF POC >=10 (A) <10   Sperm NONE SEEN   CBC     Status: Abnormal   Collection Time: 10/09/21 12:43 PM  Result Value Ref Range   WBC 8.8 4.0 - 10.5 K/uL   RBC 3.74 (L) 3.87 - 5.11 MIL/uL   Hemoglobin 11.3 (L) 12.0 - 15.0 g/dL   HCT 79.1 (L) 50.5 - 69.7 %   MCV 88.8 80.0 - 100.0 fL   MCH 30.2 26.0 - 34.0 pg   MCHC 34.0 30.0 - 36.0 g/dL   RDW 94.8 01.6 - 55.3 %   Platelets 254 150 - 400 K/uL   nRBC 0.0 0.0 - 0.2 %  Hemoglobin A1c     Status: None   Collection Time: 10/09/21 12:43 PM  Result Value Ref Range   Hgb A1c MFr Bld 5.1 4.8 - 5.6 %   Mean Plasma Glucose 99.67 mg/dL    Imaging No results found.  MAU Course  Procedures Lab Orders         Wet prep, genital         Culture, beta strep (group b only)         Urinalysis, Routine w reflex microscopic Urine, Clean Catch         CBC         Hemoglobin A1c         HIV Antibody (routine testing w rflx)         RPR    No orders of the defined types were placed in this encounter.  Imaging Orders  No imaging studies ordered today    MDM Cervix unchanged with irregular contractions while in MAU.   Swabs & labs collected since patient has had lapse in care since 23 weeks Message sent to office for f/u appointment Assessment and Plan   1. False labor  -Cervix unchanged & no regular contractions -Reviewed s/s of labor & reasons to return to MAU  2.  Limited prenatal care in third trimester  -CBC, A1C, GBS, GC/CT, & HIV collected -Message sent to [facility] for ROB f/u appointment  3. [redacted] weeks gestation of pregnancy      Judeth Horn, NP 10/09/21 2:11 PM

## 2021-10-09 NOTE — MAU Note (Signed)
Madison Soto is a 26 y.o. at [redacted]w[redacted]d here in MAU reporting: pelvic pain that began last night.  States took Aspirin at approx midnight or 0100 this morning for the pain. Reports also has had vomiting and diarrhea, has vomited once in 24 hours and three diarrhea stools.   Denies VB or LOF.  Endorses +FM. LMP: N/A Onset of complaint: yesterday Pain score: 4/10 Vitals:   10/09/21 1141  BP: 112/66  Pulse: 65  Resp: 19  Temp: 98.1 F (36.7 C)  SpO2: 99%     FHT:127 bpm Lab orders placed from triage:   UA

## 2021-10-10 LAB — GC/CHLAMYDIA PROBE AMP (~~LOC~~) NOT AT ARMC
Chlamydia: NEGATIVE
Comment: NEGATIVE
Comment: NORMAL
Neisseria Gonorrhea: NEGATIVE

## 2021-10-10 LAB — RPR: RPR Ser Ql: NONREACTIVE

## 2021-10-11 LAB — CULTURE, BETA STREP (GROUP B ONLY)

## 2021-10-13 ENCOUNTER — Ambulatory Visit (INDEPENDENT_AMBULATORY_CARE_PROVIDER_SITE_OTHER): Payer: Medicaid Other

## 2021-10-13 ENCOUNTER — Other Ambulatory Visit: Payer: Self-pay

## 2021-10-13 VITALS — BP 100/72 | HR 76 | Wt 158.4 lb

## 2021-10-13 DIAGNOSIS — Z23 Encounter for immunization: Secondary | ICD-10-CM | POA: Diagnosis not present

## 2021-10-13 DIAGNOSIS — Z3A38 38 weeks gestation of pregnancy: Secondary | ICD-10-CM

## 2021-10-13 DIAGNOSIS — O0933 Supervision of pregnancy with insufficient antenatal care, third trimester: Secondary | ICD-10-CM

## 2021-10-13 DIAGNOSIS — Z3480 Encounter for supervision of other normal pregnancy, unspecified trimester: Secondary | ICD-10-CM

## 2021-10-13 NOTE — Progress Notes (Signed)
   PRENATAL VISIT NOTE  Subjective:  Madison Soto is a 25 y.o. G3P2002 at [redacted]w[redacted]d being seen today for ongoing prenatal care.  She is currently monitored for the following issues for this low-risk pregnancy and has Vaginal delivery; Indication for care or intervention in labor or delivery; Marijuana use; and Supervision of other normal pregnancy, antepartum on their problem list.  Patient reports no complaints.  Contractions: Irritability. Vag. Bleeding: None.  Movement: Present. Denies leaking of fluid.   The following portions of the patient's history were reviewed and updated as appropriate: allergies, current medications, past family history, past medical history, past social history, past surgical history and problem list.   Objective:   Vitals:   10/13/21 0917  BP: 100/72  Pulse: 76  Weight: 158 lb 6.4 oz (71.8 kg)    Fetal Status: Fetal Heart Rate (bpm): 139 Fundal Height: 37 cm Movement: Present     General:  Alert, oriented and cooperative. Patient is in no acute distress.  Skin: Skin is warm and dry. No rash noted.   Cardiovascular: Normal heart rate noted  Respiratory: Normal respiratory effort, no problems with respiration noted  Abdomen: Soft, gravid, appropriate for gestational age.  Pain/Pressure: Present     Pelvic: Cervical exam deferred        Extremities: Normal range of motion.     Mental Status: Normal mood and affect. Normal behavior. Normal judgment and thought content.   Assessment and Plan:  Pregnancy: G3P2002 at [redacted]w[redacted]d 1. Supervision of other normal pregnancy, antepartum - Routine OB. Doing well, no concerns - Recent trip to MAU and had labs. GC/CT and GBS neg - Patient with limited care d/t work. Unable to get off for appointments. Does not get paid maternity leave and would like to return to work as soon as possible after having baby. Discussed IOL and patient prefers to wait for labor.  Would like membrane sweep at next appointment. - Discussed birth  control options, patient wants Depo  2. [redacted] weeks gestation of pregnancy - FH appropriate - Endorses active fetal movement  3. Limited prenatal care in third trimester   Term labor symptoms and general obstetric precautions including but not limited to vaginal bleeding, contractions, leaking of fluid and fetal movement were reviewed in detail with the patient. Please refer to After Visit Summary for other counseling recommendations.   Return in about 1 week (around 10/20/2021).  Future Appointments  Date Time Provider Department Center  10/20/2021  1:15 PM Marylene Land, CNM [facility]    Brand Males, CNM

## 2021-10-14 ENCOUNTER — Encounter (HOSPITAL_COMMUNITY): Payer: Self-pay

## 2021-10-14 ENCOUNTER — Ambulatory Visit (HOSPITAL_COMMUNITY)
Admission: EM | Admit: 2021-10-14 | Discharge: 2021-10-14 | Disposition: A | Discharge status: Home / Self Care | Payer: Medicaid Other | Attending: Family Medicine | Admitting: Family Medicine

## 2021-10-14 DIAGNOSIS — K047 Periapical abscess without sinus: Secondary | ICD-10-CM

## 2021-10-14 MED ORDER — AMOXICILLIN 875 MG PO TABS
875.0000 mg | ORAL_TABLET | Freq: Two times a day (BID) | ORAL | 0 refills | Status: AC
Start: 2021-10-14 — End: 2021-10-21

## 2021-10-14 NOTE — ED Provider Notes (Signed)
[facility]    CSN: [id number] Arrival date & time: 10/14/21  7829      History   Chief Complaint Chief Complaint  Patient presents with   Dental Pain    Pt complains of dental pain x2day    HPI Madison Soto is a 26 y.o. female.    Dental Pain  Here for left upper tooth pain that began 2 days ago. No fever or chills.  Does hurt to chew. He has tried a baby aspirin and it did not help  She is about [redacted] weeks pregnant Past Medical History:  Diagnosis Date   Asthma    childhood. patient was told she "grew out of it'    Patient Active Problem List   Diagnosis Date Noted   Supervision of other normal pregnancy, antepartum 05/18/2021   Indication for care or intervention in labor or delivery 05/19/2020   Marijuana use 05/19/2020   Vaginal delivery 08/03/2016    Past Surgical History:  Procedure Laterality Date   NO PAST SURGERIES      OB History     Gravida  3   Para  2   Term  2   Preterm      AB      Living  2      SAB      IAB      Ectopic      Multiple  0   Live Births  2            Home Medications    Prior to Admission medications   Medication Sig Start Date End Date Taking? Authorizing Provider  amoxicillin (AMOXIL) 875 MG tablet Take 1 tablet (875 mg total) by mouth 2 (two) times daily for 7 days. 10/14/21 10/21/21 Yes Zenia Resides, MD  acetaminophen (TYLENOL) 325 MG tablet Take 2 tablets (650 mg total) by mouth every 6 (six) hours. Patient not taking: Reported on 07/22/2021 05/21/20   Maury Dus, MD  Blood Pressure Monitoring (BLOOD PRESSURE MONITOR AUTOMAT) DEVI 1 Device by Does not apply route daily. Automatic blood pressure cuff regular size. To monitor blood pressure regularly at home. ICD-10 code:Z34.90 Patient not taking: Reported on 06/30/2021 03/18/20   Raelyn Mora, CNM  prenatal vitamin w/FE, FA (PRENATAL 1 + 1) 27-1 MG TABS tablet Take 1 tablet by mouth daily at 12 noon. Patient not taking:  Reported on 10/13/2021 05/18/21   Levie Heritage, DO    Family History Family History  Problem Relation Age of Onset   Healthy Mother    Healthy Father    Hypertension Paternal Grandmother    Hypertension Paternal Grandfather     Social History Social History   Tobacco Use   Smoking status: Former    Types: Cigars   Smokeless tobacco: Never  Vaping Use   Vaping Use: Never used  Substance Use Topics   Alcohol use: No   Drug use: Not Currently    Types: Marijuana    Comment: last smoked in July 2023     Allergies   Patient has no known allergies.   Review of Systems Review of Systems   Physical Exam Triage Vital Signs ED Triage Vitals  Enc Vitals Group     BP 10/14/21 0833 117/74     Pulse Rate 10/14/21 0833 (!) 125     Resp 10/14/21 0833 12     Temp 10/14/21 0833 98.2 F (36.8 C)     Temp Source 10/14/21 0833 Oral  SpO2 10/14/21 0833 96 %     Weight 10/14/21 0834 158 lb (71.7 kg)     Height 10/14/21 0834 5\' 5"  (1.651 m)     Head Circumference --      Peak Flow --      Pain Score 10/14/21 0834 10     Pain Loc --      Pain Edu? --      Excl. in GC? --    No data found.  Updated Vital Signs BP 117/74 (BP Location: Left Arm)   Pulse (!) 125   Temp 98.2 F (36.8 C) (Oral)   Resp 12   Ht 5\' 5"  (1.651 m)   Wt 71.7 kg   LMP 01/16/2021   SpO2 96%   BMI 26.29 kg/m   Visual Acuity Right Eye Distance:   Left Eye Distance:   Bilateral Distance:    Right Eye Near:   Left Eye Near:    Bilateral Near:     Physical Exam Vitals reviewed.  Constitutional:      General: She is not in acute distress.    Appearance: She is not ill-appearing, toxic-appearing or diaphoretic.  HENT:     Mouth/Throat:     Mouth: Mucous membranes are moist.     Pharynx: No oropharyngeal exudate or posterior oropharyngeal erythema.     Comments: There is a little swelling around her upper posterior left molar.  No abscess and no purulent drainage seen. Cardiovascular:      Rate and Rhythm: Normal rate and regular rhythm.  Pulmonary:     Effort: Pulmonary effort is normal.     Breath sounds: Normal breath sounds.  Skin:    Coloration: Skin is not jaundiced or pale.  Neurological:     Mental Status: She is alert and oriented to person, place, and time.  Psychiatric:        Behavior: Behavior normal.      UC Treatments / Results  Labs (all labs ordered are listed, but only abnormal results are displayed) Labs Reviewed - No data to display  EKG   Radiology No results found.  Procedures Procedures (including critical care time)  Medications Ordered in UC Medications - No data to display  Initial Impression / Assessment and Plan / UC Course  I have reviewed the triage vital signs and the nursing notes.  Pertinent labs & imaging results that were available during my care of the patient were reviewed by me and considered in my medical decision making (see chart for details).     Amoxicillin for the oral infection.  I have asked her to please get some Tylenol 500 mg and take 2 of those every 4 hours as needed for pain Final Clinical Impressions(s) / UC Diagnoses   Final diagnoses:  Dental infection     Discharge Instructions      Take amoxicillin 875 mg--1 tab twice daily for 7 days  Take Tylenol 500 mg--2 every 4 hours as needed for pain (or fever)     ED Prescriptions     Medication Sig Dispense Auth. Provider   amoxicillin (AMOXIL) 875 MG tablet Take 1 tablet (875 mg total) by mouth 2 (two) times daily for 7 days. 14 tablet Justeen Hehr, , MD      I have reviewed the PDMP during this encounter.   01/18/2021, MD 10/14/21 0900

## 2021-10-14 NOTE — Discharge Instructions (Addendum)
Take amoxicillin 875 mg--1 tab twice daily for 7 days  Take Tylenol 500 mg--2 every 4 hours as needed for pain (or fever)

## 2021-10-14 NOTE — ED Triage Notes (Signed)
Dental pain x 2 days

## 2021-10-15 NOTE — Progress Notes (Unsigned)
   PRENATAL VISIT NOTE  Subjective:  Madison Soto is a 26 y.o. G3P2002 at [redacted]w[redacted]d being seen today for ongoing prenatal care.  She is currently monitored for the following issues for this low-risk pregnancy and has Vaginal delivery; Indication for care or intervention in labor or delivery; Marijuana use; and Supervision of other normal pregnancy, antepartum on their problem list.  Patient reports {sx:[id number]}.   .  .   . Denies leaking of fluid.   The following portions of the patient's history were reviewed and updated as appropriate: allergies, current medications, past family history, past medical history, past social history, past surgical history and problem list.   Objective:  There were no vitals filed for this visit.  Fetal Status:           General:  Alert, oriented and cooperative. Patient is in no acute distress.  Skin: Skin is warm and dry. No rash noted.   Cardiovascular: Normal heart rate noted  Respiratory: Normal respiratory effort, no problems with respiration noted  Abdomen: Soft, gravid, appropriate for gestational age.        Pelvic: {Blank single:19197::"Cervical exam performed in the presence of a chaperone","Cervical exam deferred"}        Extremities: Normal range of motion.     Mental Status: Normal mood and affect. Normal behavior. Normal judgment and thought content.   Assessment and Plan:  Pregnancy: G3P2002 at [redacted]w[redacted]d There are no diagnoses linked to this encounter. {Blank single:19197::"Term","Preterm"} labor symptoms and general obstetric precautions including but not limited to vaginal bleeding, contractions, leaking of fluid and fetal movement were reviewed in detail with the patient. Please refer to After Visit Summary for other counseling recommendations.   No follow-ups on file.  Future Appointments  Date Time Provider Department Center  10/20/2021  1:15 PM Madlyn Frankel [facility]    Charlesetta Garibaldi [place], [place]

## 2021-10-20 ENCOUNTER — Other Ambulatory Visit: Payer: Self-pay

## 2021-10-20 ENCOUNTER — Ambulatory Visit (INDEPENDENT_AMBULATORY_CARE_PROVIDER_SITE_OTHER): Payer: Medicaid Other | Admitting: Student

## 2021-10-20 VITALS — BP 114/62 | HR 68 | Wt 160.0 lb

## 2021-10-20 DIAGNOSIS — Z3480 Encounter for supervision of other normal pregnancy, unspecified trimester: Secondary | ICD-10-CM

## 2021-10-20 DIAGNOSIS — Z3483 Encounter for supervision of other normal pregnancy, third trimester: Secondary | ICD-10-CM | POA: Diagnosis not present

## 2021-10-20 DIAGNOSIS — Z3A39 39 weeks gestation of pregnancy: Secondary | ICD-10-CM

## 2021-10-20 LAB — GLUCOSE, CAPILLARY: Glucose-Capillary: 64 mg/dL — ABNORMAL LOW (ref 70–99)

## 2021-10-20 NOTE — Patient Instructions (Signed)
New Induction of Labor Process for [facility]  In Fall 2020 [facility] changed it's process for scheduling inductions of labor to create more induction slots and to make sure patients get COVID-19 testing in advance. After you have been tested you need to quarantine so that you do not get infected after your test. You should not go anywhere after your test except necessary medical appointments.   You have been scheduled for induction of labor on 10/22/2021. Although you may have a specific time listed on your After Visit Summary or MyChart, we cannot predict when your room will be available. Please disregard this time. A Labor and Delivery staff member will call you on the day that you are scheduled when your room is available. You will need to arrive within one hour of being called. If you do not arrive within this time frame, the next person on the list will be called in and you will move down the list. You may eat a light meal before coming to the hospital. If you go into labor, think your water has broken, experience bright red bleeding or don't feel your baby moving as much as usual before your induction, please call your Ob/Gyn's office or come to Entrance C, Maternity Assessment Unit for evaluation.  Thank you,  [facility] [employer]

## 2021-10-21 ENCOUNTER — Other Ambulatory Visit: Payer: Self-pay | Admitting: Advanced Practice Midwife

## 2021-10-21 ENCOUNTER — Telehealth (HOSPITAL_COMMUNITY): Payer: Self-pay | Admitting: *Deleted

## 2021-10-21 ENCOUNTER — Encounter (HOSPITAL_COMMUNITY): Payer: Self-pay | Admitting: *Deleted

## 2021-10-21 NOTE — Telephone Encounter (Signed)
Preadmission screen  

## 2021-10-22 ENCOUNTER — Inpatient Hospital Stay (HOSPITAL_COMMUNITY): Payer: Medicaid Other

## 2021-10-22 ENCOUNTER — Encounter (HOSPITAL_COMMUNITY): Payer: Self-pay | Admitting: Student

## 2021-10-22 ENCOUNTER — Other Ambulatory Visit: Payer: Self-pay

## 2021-10-22 ENCOUNTER — Inpatient Hospital Stay (HOSPITAL_COMMUNITY)
Admission: AD | Admit: 2021-10-22 | Discharge: 2021-10-23 | DRG: 807 | Disposition: A | Discharge status: Home / Self Care | Payer: Medicaid Other | Attending: Obstetrics & Gynecology | Admitting: Obstetrics & Gynecology

## 2021-10-22 DIAGNOSIS — O26893 Other specified pregnancy related conditions, third trimester: Secondary | ICD-10-CM | POA: Diagnosis not present

## 2021-10-22 DIAGNOSIS — Z87891 Personal history of nicotine dependence: Secondary | ICD-10-CM | POA: Diagnosis not present

## 2021-10-22 DIAGNOSIS — Z3A39 39 weeks gestation of pregnancy: Secondary | ICD-10-CM

## 2021-10-22 DIAGNOSIS — Z3480 Encounter for supervision of other normal pregnancy, unspecified trimester: Principal | ICD-10-CM

## 2021-10-22 LAB — CBC
HCT: 32.4 % — ABNORMAL LOW (ref 36.0–46.0)
Hemoglobin: 11.3 g/dL — ABNORMAL LOW (ref 12.0–15.0)
MCH: 31.4 pg (ref 26.0–34.0)
MCHC: 34.9 g/dL (ref 30.0–36.0)
MCV: 90 fL (ref 80.0–100.0)
Platelets: 313 10*3/uL (ref 150–400)
RBC: 3.6 MIL/uL — ABNORMAL LOW (ref 3.87–5.11)
RDW: 14.1 % (ref 11.5–15.5)
WBC: 7 10*3/uL (ref 4.0–10.5)
nRBC: 0 % (ref 0.0–0.2)

## 2021-10-22 LAB — TYPE AND SCREEN
ABO/RH(D): A POS
Antibody Screen: NEGATIVE

## 2021-10-22 LAB — RPR: RPR Ser Ql: NONREACTIVE

## 2021-10-22 MED ORDER — TETANUS-DIPHTH-ACELL PERTUSSIS 5-2.5-18.5 LF-MCG/0.5 IM SUSY: 0.5000 mL | PREFILLED_SYRINGE | Freq: Once | INTRAMUSCULAR | Status: DC

## 2021-10-22 MED ORDER — DIPHENHYDRAMINE HCL 25 MG PO CAPS: 25.0000 mg | ORAL_CAPSULE | Freq: Four times a day (QID) | ORAL | Status: DC | PRN

## 2021-10-22 MED ORDER — OXYTOCIN-SODIUM CHLORIDE 30-0.9 UT/500ML-% IV SOLN
1.0000 m[IU]/min | INTRAVENOUS | Status: DC
  Administered 2021-10-22: 2 m[IU]/min via INTRAVENOUS
  Filled 2021-10-22: qty 500

## 2021-10-22 MED ORDER — PRENATAL MULTIVITAMIN CH
1.0000 | ORAL_TABLET | Freq: Every day | ORAL | Status: DC
  Administered 2021-10-23: 1 via ORAL
  Filled 2021-10-22: qty 1

## 2021-10-22 MED ORDER — SOD CITRATE-CITRIC ACID 500-334 MG/5ML PO SOLN: 30.0000 mL | ORAL | Status: DC | PRN

## 2021-10-22 MED ORDER — OXYTOCIN BOLUS FROM INFUSION
333.0000 mL | Freq: Once | INTRAVENOUS | Status: AC
  Administered 2021-10-22: 333 mL via INTRAVENOUS

## 2021-10-22 MED ORDER — BENZOCAINE-MENTHOL 20-0.5 % EX AERO: 1.0000 | INHALATION_SPRAY | CUTANEOUS | Status: DC | PRN

## 2021-10-22 MED ORDER — FENTANYL CITRATE (PF) 100 MCG/2ML IJ SOLN
INTRAMUSCULAR | Status: AC
  Filled 2021-10-22: qty 2

## 2021-10-22 MED ORDER — ONDANSETRON HCL 4 MG/2ML IJ SOLN: 4.0000 mg | INTRAMUSCULAR | Status: DC | PRN

## 2021-10-22 MED ORDER — SIMETHICONE 80 MG PO CHEW: 80.0000 mg | CHEWABLE_TABLET | ORAL | Status: DC | PRN

## 2021-10-22 MED ORDER — ACETAMINOPHEN 325 MG PO TABS
650.0000 mg | ORAL_TABLET | ORAL | Status: DC | PRN
  Administered 2021-10-23: 650 mg via ORAL
  Filled 2021-10-22: qty 2

## 2021-10-22 MED ORDER — OXYCODONE-ACETAMINOPHEN 5-325 MG PO TABS: 2.0000 | ORAL_TABLET | ORAL | Status: DC | PRN

## 2021-10-22 MED ORDER — ZOLPIDEM TARTRATE 5 MG PO TABS: 5.0000 mg | ORAL_TABLET | Freq: Every evening | ORAL | Status: DC | PRN

## 2021-10-22 MED ORDER — ACETAMINOPHEN 325 MG PO TABS: 650.0000 mg | ORAL_TABLET | ORAL | Status: DC | PRN

## 2021-10-22 MED ORDER — DIBUCAINE (PERIANAL) 1 % EX OINT: 1.0000 | TOPICAL_OINTMENT | CUTANEOUS | Status: DC | PRN

## 2021-10-22 MED ORDER — OXYCODONE-ACETAMINOPHEN 5-325 MG PO TABS: 1.0000 | ORAL_TABLET | ORAL | Status: DC | PRN

## 2021-10-22 MED ORDER — LACTATED RINGERS IV SOLN: INTRAVENOUS | Status: DC

## 2021-10-22 MED ORDER — OXYTOCIN-SODIUM CHLORIDE 30-0.9 UT/500ML-% IV SOLN
2.5000 [IU]/h | INTRAVENOUS | Status: DC
  Administered 2021-10-22: 2.5 [IU]/h via INTRAVENOUS

## 2021-10-22 MED ORDER — LIDOCAINE HCL (PF) 1 % IJ SOLN: 30.0000 mL | INTRAMUSCULAR | Status: DC | PRN

## 2021-10-22 MED ORDER — IBUPROFEN 600 MG PO TABS
600.0000 mg | ORAL_TABLET | Freq: Four times a day (QID) | ORAL | Status: DC
  Administered 2021-10-22 – 2021-10-23 (×4): 600 mg via ORAL
  Filled 2021-10-22 (×5): qty 1

## 2021-10-22 MED ORDER — ONDANSETRON HCL 4 MG PO TABS: 4.0000 mg | ORAL_TABLET | ORAL | Status: DC | PRN

## 2021-10-22 MED ORDER — FENTANYL CITRATE (PF) 100 MCG/2ML IJ SOLN
100.0000 ug | INTRAMUSCULAR | Status: DC | PRN
  Administered 2021-10-22: 100 ug via INTRAVENOUS

## 2021-10-22 MED ORDER — SENNOSIDES-DOCUSATE SODIUM 8.6-50 MG PO TABS
2.0000 | ORAL_TABLET | ORAL | Status: DC
  Filled 2021-10-22: qty 2

## 2021-10-22 MED ORDER — WITCH HAZEL-GLYCERIN EX PADS: 1.0000 | MEDICATED_PAD | CUTANEOUS | Status: DC | PRN

## 2021-10-22 MED ORDER — LACTATED RINGERS IV SOLN: 500.0000 mL | INTRAVENOUS | Status: DC | PRN

## 2021-10-22 MED ORDER — TERBUTALINE SULFATE 1 MG/ML IJ SOLN: 0.2500 mg | Freq: Once | INTRAMUSCULAR | Status: DC | PRN

## 2021-10-22 MED ORDER — ONDANSETRON HCL 4 MG/2ML IJ SOLN: 4.0000 mg | Freq: Four times a day (QID) | INTRAMUSCULAR | Status: DC | PRN

## 2021-10-22 MED ORDER — OXYTOCIN-SODIUM CHLORIDE 30-0.9 UT/500ML-% IV SOLN
1.0000 m[IU]/min | INTRAVENOUS | Status: DC
  Administered 2021-10-22: 2 m[IU]/min via INTRAVENOUS

## 2021-10-22 MED ORDER — COCONUT OIL OIL
1.0000 | TOPICAL_OIL | Status: DC | PRN
Start: 2021-10-22 — End: 2021-10-23

## 2021-10-22 NOTE — H&P (Addendum)
OBSTETRIC ADMISSION HISTORY AND PHYSICAL  Madison Soto is a 26 y.o. female G3P2002 with IUP at [redacted]w[redacted]d presenting for eIOL. She reports +FMs. No LOF, VB, blurry vision, headaches, peripheral edema, or RUQ pain. She plans on formula feeding. She requests deprovera for birth control. No PNC since 23 weeks. Expecting a boy.  Dating: By LMP --->  Estimated Date of Delivery: 10/23/21  Sono:    @[redacted]w[redacted]d , normal anatomy, vertex presentation, 962g, 35%ile, EFW 2lb 2oz   Prenatal History/Complications: None  Past Medical History: Past Medical History:  Diagnosis Date   Asthma    childhood. patient was told she "grew out of it'    Past Surgical History: Past Surgical History:  Procedure Laterality Date   NO PAST SURGERIES      Obstetrical History: OB History     Gravida  3   Para  2   Term  2   Preterm      AB      Living  2      SAB      IAB      Ectopic      Multiple  0   Live Births  2           Social History: Social History   Socioeconomic History   Marital status: Single    Spouse name: Not on file   Number of children: 1   Years of education: Not on file   Highest education level: High school graduate  Occupational History   Not on file  Tobacco Use   Smoking status: Former    Types: Cigars   Smokeless tobacco: Never  Vaping Use   Vaping Use: Never used  Substance and Sexual Activity   Alcohol use: No   Drug use: Not Currently    Types: Marijuana    Comment: last smoked in July 2023   Sexual activity: Yes    Birth control/protection: None  Other Topics Concern   Not on file  Social History Narrative   Not on file   Social Determinants of Health   Financial Resource Strain: Not on file  Food Insecurity: Not on file  Transportation Needs: Not on file  Physical Activity: Not on file  Stress: Not on file  Social Connections: Not on file    Family History: Family History  Problem Relation Age of Onset   Healthy Mother     Healthy Father    Hypertension Paternal Grandmother    Hypertension Paternal Grandfather     Allergies: No Known Allergies  Medications Prior to Admission  Medication Sig Dispense Refill Last Dose   acetaminophen (TYLENOL) 325 MG tablet Take 2 tablets (650 mg total) by mouth every 6 (six) hours. (Patient not taking: Reported on 07/22/2021)      Blood Pressure Monitoring (BLOOD PRESSURE MONITOR AUTOMAT) DEVI 1 Device by Does not apply route daily. Automatic blood pressure cuff regular size. To monitor blood pressure regularly at home. ICD-10 code:Z34.90 (Patient not taking: Reported on 06/30/2021) 1 each 0    prenatal vitamin w/FE, FA (PRENATAL 1 + 1) 27-1 MG TABS tablet Take 1 tablet by mouth daily at 12 noon. (Patient not taking: Reported on 10/13/2021) 30 tablet 11      Review of Systems:  All systems reviewed and negative except as stated in HPI  PE: Blood pressure 111/63, pulse 79, temperature 98.4 F (36.9 C), temperature source Oral, resp. rate 20, height 5\' 5"  (1.651 m), weight 72 kg, last menstrual period 01/16/2021,  unknown if currently breastfeeding. General appearance: alert and no distress Lungs: regular rate and effort Heart: regular rate  Abdomen: soft, non-tender Extremities: Homans sign is negative, no sign of DVT Presentation: cephalic, 7 lbs by Leopolds EFM: 135 bpm, moderate variability, accels, no decels Toco: nonpainful contractions every 7-8 minutes   SVE: 3/70/-2, soft, mispositioned  Prenatal labs: ABO, Rh: --/--/PENDING (08/26 1914) Antibody: PENDING (08/26 0819) Rubella: 2.15 (04/05 1411) RPR: NON REACTIVE (08/13 1243)  HBsAg: Negative (04/05 1411)  HIV: Non Reactive (08/13 1243)  GBS:   negative 2 hr GTT missed  Prenatal Transfer Tool  Maternal Diabetes: No Genetic Screening: Normal Maternal Ultrasounds/Referrals: Normal Fetal Ultrasounds or other Referrals:  None Maternal Substance Abuse:  THC Significant Maternal Medications:   None Significant Maternal Lab Results: Group B Strep negative  Results for orders placed or performed during the hospital encounter of 10/22/21 (from the past 24 hour(s))  CBC   Collection Time: 10/22/21  8:19 AM  Result Value Ref Range   WBC 7.0 4.0 - 10.5 K/uL   RBC 3.60 (L) 3.87 - 5.11 MIL/uL   Hemoglobin 11.3 (L) 12.0 - 15.0 g/dL   HCT 78.2 (L) 95.6 - 21.3 %   MCV 90.0 80.0 - 100.0 fL   MCH 31.4 26.0 - 34.0 pg   MCHC 34.9 30.0 - 36.0 g/dL   RDW 08.6 57.8 - 46.9 %   Platelets 313 150 - 400 K/uL   nRBC 0.0 0.0 - 0.2 %  Type and screen   Collection Time: 10/22/21  8:19 AM  Result Value Ref Range   ABO/RH(D) PENDING    Antibody Screen PENDING    Sample Expiration      10/25/2021,2359 Performed at [facility], 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 G3P2002 at [redacted]w[redacted]d here for eIOL.  1. Labor: latent, with mild irregular contractions 2. FWB: Cat I 3. Pain: None 4. GBS: Negative   Plan: Admit to L&D for IOL with pitocin, AROM.  Anticipate vaginal delivery  Wyn Forster, MD  10/22/2021, 8:46 AM    Midwife attestation: I have seen and examined this patient; I agree with above documentation in the resident's note.   Madison Soto is a 26 y.o. G3P2002 here for elective IOL  PE: BP (!) 96/47   Pulse 68   Temp 98.4 F (36.9 C) (Oral)   Resp 20   Ht 5\' 5"  (1.651 m)   Wt 72 kg   LMP 01/16/2021   BMI 26.43 kg/m  Gen: calm comfortable, NAD Resp: normal effort, no distress Abd: gravid  ROS, labs, PMH reviewed  Plan: Admit to LD Labor: Pitocin initiated on admission, consider AROM when  appropriate Fetal monitoring: Category I ID: GBS negative  01/18/2021, CNM  10/22/2021, 11:19 AM

## 2021-10-22 NOTE — Progress Notes (Signed)
Labor Progress Note Madison Soto is a 26 y.o. G3P2002 at [redacted]w[redacted]d presented for eIOL S: Patient is resting comfortably, between contractions  O:  BP (!) 96/47   Pulse 68   Temp 98.4 F (36.9 C) (Oral)   Resp 20   Ht 5\' 5"  (1.651 m)   Wt 72 kg   LMP 01/16/2021   BMI 26.43 kg/m  EFM: 120/accels/no decels/moderate variability  CVE: Dilation: 3 Effacement (%): 70 Station: -2 Presentation: Vertex Exam by:: Kennon Encinas MD   A&P: 26 y.o. 30 [redacted]w[redacted]d progressing well with inductions. Agreeable to AROM.   #Labor: Progressing well. AROM clear fluid @1104 . Lower pitocin titration to 1 unit at a time given strong response, patient tolerating this well.  #Pain: Minimal #FWB: Cat I #GBS negative  [redacted]w[redacted]d, MD [facility], [facility] 11:08 AM

## 2021-10-22 NOTE — Progress Notes (Signed)
Mom is insistent on going outside and smoking, although she was educated that we don't allow patients to go outside at this early in the postpartum process.

## 2021-10-22 NOTE — Discharge Summary (Signed)
Postpartum Discharge Summary  Date of Service updated-8/27     Patient Name: Madison Soto DOB: 10/23/1995 MRN: [id number]  Date of admission: 10/22/2021 Delivery date:10/22/2021  Delivering provider: Fatima Blank A  Date of discharge: 10/23/2021  Admitting diagnosis: Indication for care in labor and delivery, antepartum [O75.9] Intrauterine pregnancy: [redacted]w[redacted]d    Secondary diagnosis:  Principal Problem:   Indication for care in labor and delivery, antepartum Active Problems:   Spontaneous vaginal delivery  Additional problems: none    Discharge diagnosis: Term Pregnancy Delivered                                              Post partum procedures: none Augmentation: AROM and Pitocin Complications: None  Hospital course: Induction of Labor With Vaginal Delivery   26y.o. yo G3P2002 at 348w6das admitted to the hospital 10/22/2021 for induction of labor.  Indication for induction: Elective.  Patient had an uncomplicated labor course as follows: Membrane Rupture Time/Date: 11:04 AM ,10/22/2021   Delivery Method:Vaginal, Spontaneous  Episiotomy: None  Lacerations:  None  Details of delivery can be found in separate delivery note.  Patient had a routine postpartum course. Patient is discharged home 10/24/21.  Newborn Data: Birth date:10/22/2021  Birth time:3:00 PM  Gender:Female  Living status:Living  Apgars:8 ,9  Weight:3180 g   Magnesium Sulfate received: No BMZ received: No Rhophylac:No MMR:N/A T-DaP:Given prenatally Flu: N/A Transfusion:No  Physical exam  Vitals:   10/22/21 2118 10/23/21 0107 10/23/21 0615 10/23/21 1450  BP: 118/74 107/69 116/69 109/79  Pulse: 75 62 62 (!) 57  Resp: _0 Temp: 98.2 F (36.8 C) 98.1 F (36.7 C) 98.2 F (36.8 C) 98.2 F (36.8 C)  TempSrc: Oral Oral Oral Oral  SpO2: 98% 98% 98% 100%  Weight:      Height:       General: alert, cooperative, and no distress Lochia: appropriate Uterine Fundus: firm Incision:  N/A DVT Evaluation: No evidence of DVT seen on physical exam. Labs: Lab Results  Component Value Date   WBC 7.0 10/22/2021   HGB 11.3 (L) 10/22/2021   HCT 32.4 (L) 10/22/2021   MCV 90.0 10/22/2021   PLT 313 10/22/2021      Latest Ref Rng & Units 10/14/2017    5:30 PM  CMP  Glucose 70 - 99 mg/dL 86   BUN 6 - 20 mg/dL 12   Creatinine 0.44 - 1.00 mg/dL 0.87   Sodium 135 - 145 mmol/L 138   Potassium 3.5 - 5.1 mmol/L 3.5   Chloride 98 - 111 mmol/L 106   CO2 22 - 32 mmol/L 24   Calcium 8.9 - 10.3 mg/dL 9.1   Total Protein 6.5 - 8.1 g/dL 8.0   Total Bilirubin 0.3 - 1.2 mg/dL 0.9   Alkaline Phos 38 - 126 U/L 68   AST 15 - 41 U/L 14   ALT 0 - 44 U/L 10    Edinburgh Score:    10/22/2021    6:19 PM  Edinburgh Postnatal Depression Scale Screening Tool  I have been able to laugh and see the funny side of things. 0  I have looked forward with enjoyment to things. 0  I have blamed myself unnecessarily when things went wrong. 0  I have been anxious or worried for no good reason. 0  I have felt  scared or panicky for no good reason. 0  Things have been getting on top of me. 0  I have been so unhappy that I have had difficulty sleeping. 0  I have felt sad or miserable. 0  I have been so unhappy that I have been crying. 0  The thought of harming myself has occurred to me. 0  Edinburgh Postnatal Depression Scale Total 0     After visit meds:  Allergies as of 10/23/2021   No Known Allergies      Medication List     TAKE these medications    acetaminophen 325 MG tablet Commonly known as: Tylenol Take 2 tablets (650 mg total) by mouth every 6 (six) hours. What changed: Another medication with the same name was added. Make sure you understand how and when to take each.   acetaminophen 325 MG tablet Commonly known as: Tylenol Take 2 tablets (650 mg total) by mouth every 6 (six) hours as needed (for pain scale < 4). What changed: You were already taking a medication with the same  name, and this prescription was added. Make sure you understand how and when to take each.   Blood Pressure Monitor Automat Devi 1 Device by Does not apply route daily. Automatic blood pressure cuff regular size. To monitor blood pressure regularly at home. ICD-10 code:Z34.90   ibuprofen 600 MG tablet Commonly known as: ADVIL Take 1 tablet (600 mg total) by mouth every 6 (six) hours.   prenatal vitamin w/FE, FA 27-1 MG Tabs tablet Take 1 tablet by mouth daily at 12 noon.         Discharge home in stable condition Infant Feeding: Bottle Infant Disposition:home with mother Discharge instruction: per After Visit Summary and Postpartum booklet. Activity: Advance as tolerated. Pelvic rest for 6 weeks.  Diet: routine diet Future Appointments: Future Appointments  Date Time Provider Department Center  12/01/2021  1:55 PM Kooistra, Kathryn Lorraine, CNM [facility]   Follow up Visit:  Follow-up Information     [facility]. Schedule an appointment as soon as possible for a visit in 5 week(s).   Specialty: Obstetrics and Gynecology Contact information: [address] [place] [address] [phone]                Message sent 10/22/21 by Victor Cresenzo   Please schedule this patient for a In person postpartum visit in 6 weeks with the following provider: Any provider. Additional Postpartum F/U: None   Low risk pregnancy complicated by:  NA Delivery mode: Vaginal  Anticipated Birth Control:  Depo   Jennifer Ozan, DO Attending Obstetrician & Gynecologist, [facility], [facility]      

## 2021-10-23 DIAGNOSIS — Z3A39 39 weeks gestation of pregnancy: Secondary | ICD-10-CM | POA: Diagnosis not present

## 2021-10-23 MED ORDER — IBUPROFEN 600 MG PO TABS: 600.0000 mg | ORAL_TABLET | Freq: Four times a day (QID) | ORAL | 0 refills | Status: DC

## 2021-10-23 MED ORDER — MEDROXYPROGESTERONE ACETATE 150 MG/ML IM SUSP
150.0000 mg | Freq: Once | INTRAMUSCULAR | Status: AC
  Administered 2021-10-23: 150 mg via INTRAMUSCULAR
  Filled 2021-10-23: qty 1

## 2021-10-23 MED ORDER — ACETAMINOPHEN 325 MG PO TABS: 650.0000 mg | ORAL_TABLET | Freq: Four times a day (QID) | ORAL | Status: DC | PRN

## 2021-10-23 NOTE — Clinical Social Work Maternal (Signed)
CLINICAL SOCIAL WORK MATERNAL/CHILD NOTE  Patient Details  Name: Madison Soto MRN: [id number] Date of Birth: 07/31/1995  Date:  10/23/2021  Clinical Social Worker Initiating Note:  Adriana Lina, LCSWA Date/Time: Initiated:  10/23/21/1905     Child's Name:  Madison Soto   Biological Parents:  Mother, Father (FOB Wrigley Soto, DOB: 04/11/1990)   Need for Interpreter:  None   Reason for Referral:  Current Substance Use/Substance Use During Pregnancy  , Late or No Prenatal Care     Address:  [address] [place], [place] [address]   Phone number:  [phone] (home)     Additional phone number:   Household Members/Support Persons (HM/SP):   Household Member/Support Person 1, Household Member/Support Person 2   HM/SP Name Relationship DOB or Age  HM/SP -1 Xavier Carsey Son 08/02/2016  HM/SP -2 Jeriah Soto Son  05/20/2020  HM/SP -3        HM/SP -4        HM/SP -5        HM/SP -6        HM/SP -7        HM/SP -8          Natural Supports (not living in the home):  Spouse/significant other, Extended Family   Professional Supports: None   Employment: Part-time   Type of Work: [employer]   Education:  High school graduate   Homebound arranged:    Financial Resources:  Medicaid   Other Resources:  Food Stamps  , WIC   Cultural/Religious Considerations Which May Impact Care:  None identified  Strengths:  Ability to meet basic needs  , Pediatrician chosen, Home prepared for child     Psychotropic Medications:         Pediatrician:    [place] area  Pediatrician List:   [facility]  [place]    [place]    [place]    [place]    [place]      Pediatrician Fax Number:    Risk Factors/Current Problems:  Substance Use     Cognitive State:  Able to Concentrate  , Alert  , Linear Thinking  , Goal Oriented     Mood/Affect:  Comfortable  , Interested  , Relaxed  , Calm     CSW Assessment: CSW  received consult due to documented THC use during pregnancy and limited prenatal care. CSW met with MOB at bedside to complete assessment and offer support/resources. When MOB entered room, MOB was observed sitting in hospital bed. Infant was asleep on his back in bassinet. CSW introduced self and explained reason for consult. MOB was polite and remained engaged throughout encounter.   CSW inquired about MOB's mental health history. MOB reports she did not experience postpartum depression with her 2 older sons and reports she has no mental health diagnoses/history. MOB identified FOB and her aunt as supports. MOB denied current SI/HI/DV.   CSW informed MOB about hospital drug screen policy due to documented use of marijuana during pregnancy. CSW explained that infant's UDS and CDS would be monitored and a CPS report would be made if warranted. MOB verbalized understanding. CSW inquired about substance use during pregnancy. MOB reported she smoked marijuana during the beginning of pregnancy and stated she stopped smoking marijuana during the last couple months. MOB reports she smoked marijuana to help her eat. MOB reports her last use was 2 months ago. CSW inquired if MOB has CPS history. MOB reports after   she gave birth to her son, Jeriah, CPS came to her house due to infant testing positive for THC. MOB reports her case was closed shortly after CPS visited her home.   MOB reports she has all needed items for infant, including a car seat and bassinet. MOB has chosen [facility] as infant's pediatrician office. MOB reports she receives WIC and food stamps. CSW encouraged MOB to contact her caseworkers to have infant added to her benefits. MOB declined additional resource needs at this time.   CSW provided education regarding the baby blues period vs. perinatal mood disorders, discussed treatment and offered resources for mental health follow up if concerns arise.  CSW recommends self-evaluation during the  postpartum time period using the New Mom Checklist from Postpartum Progress and encouraged MOB to contact a medical professional if symptoms are noted at any time.    CSW provided review of Sudden Infant Death Syndrome (SIDS) precautions.    CSW identifies no further need for intervention and no barriers to discharge at this time.  CSW Plan/Description:  No Further Intervention Required/No Barriers to Discharge, CSW Will Continue to Monitor Umbilical Cord Tissue Drug Screen Results and Make Report if Warranted, Perinatal Mood and Anxiety Disorder (PMADs) Education, Sudden Infant Death Syndrome (SIDS) Education, Hospital Drug Screen Policy Information    Kyal Arts K Saren Corkern, LCSWA 10/23/2021, 7:08 PM 

## 2021-10-23 NOTE — Progress Notes (Signed)
Post Partum Day 1  Subjective: Doing well. No acute events overnight. Pain is controlled and bleeding is appropriate. She is eating, drinking, voiding, and ambulating without issue. She is bottle feeding which is going well. She has no other concerns at this time.  Objective: Blood pressure 116/69, pulse 62, temperature 98.2 F (36.8 C), temperature source Oral, resp. rate 18, height 5\' 5"  (1.651 m), weight 72 kg, last menstrual period 01/16/2021, SpO2 98 %, unknown if currently breastfeeding.  Physical Exam:  General: alert, cooperative, and no distress Lochia: appropriate Uterine Fundus: firm Incision: n/a DVT Evaluation: No evidence of DVT seen on physical exam.  Recent Labs    10/22/21 0819  HGB 11.3*  HCT 32.4*    Assessment/Plan: Madison Soto is a 26 y.o. s/p NSVD on PPD# 1.  Progressing well. Meeting postpartum milestones. VSS. Continue routine postpartum care.  Feeding: bottle Contraception: Depot Circumcision: desires circ- inform consent obtained, see below  Dispo: Continue routine postpartum care, may consider early discharge home today   LOS: 1 day   30, DO  10/23/2021, 10:02 AM   Circumcision Consent  Discussed with mom at bedside about circumcision.   Circumcision is a surgery that removes the skin that covers the tip of the penis, called the "foreskin." Circumcision is usually done when a boy is between 63 and 41 days old, sometimes up to 71-79 weeks old.  The most common reasons boys are circumcised include for cultural/religious beliefs or for parental preference (potentially easier to clean, so baby looks like daddy, etc).  There may be some medical benefits for circumcision:   Circumcised boys seem to have slightly lower rates of: ? Urinary tract infections (per the American Academy of Pediatrics an uncircumcised boy has a 1/100 chance of developing a UTI in the first year of life, a circumcised boy at a 02/998 chance of developing a UTI  in the first year of life- a 10% reduction) ? Penis cancer (typically rare- an uncircumcised female has a 1 in 100,000 chance of developing cancer of the penis) ? Sexually transmitted infection (in endemic areas, including HIV, HPV and Herpes- circumcision does NOT protect against gonorrhea, chlamydia, trachomatis, or syphilis) ? Phimosis: a condition where that makes retraction of the foreskin over the glans impossible (0.4 per 1000 boys per year or 0.6% of boys are affected by their 15th birthday)  Boys and men who are not circumcised can reduce these extra risks by: ? Cleaning their penis well ? Using condoms during sex  What are the risks of circumcision?  As with any surgical procedure, there are risks and complications. In circumcision, complications are rare and usually minor, the most common being: ? Bleeding- risk is reduced by holding each clamp for 30 seconds prior to a cut being made, and by holding pressure after the procedure is done ? Infection- the penis is cleaned prior to the procedure, and the procedure is done under sterile technique ? Damage to the urethra or amputation of the penis  How is circumcision done in baby boys?  The baby will be placed on a special table and the legs restrained for their safety. Numbing medication is injected into the penis, and the skin is cleansed with betadine to decrease the risk of infection.   What to expect:  The penis will look red and raw for 5-7 days as it heals. We expect scabbing around where the cut was made, as well as clear-pink fluid and some swelling of the penis  right after the procedure. If your baby's circumcision starts to bleed or develops pus, please contact your pediatrician immediately.  All questions were answered and mother consented.  Myna Hidalgo, DO Attending Obstetrician & Gynecologist, [facility] for [employer], [facility]

## 2021-10-29 ENCOUNTER — Telehealth (HOSPITAL_COMMUNITY): Payer: Self-pay

## 2021-10-29 NOTE — Telephone Encounter (Signed)
Patient reports feeling good. Patient declines questions/concerns about her health and healing.  Patient reports that baby is doing well. Eating, peeing/pooping, and gaining weight well. Baby sleeps in a bassinet. RN reviewed ABC's of safe sleep with patient. Patient declines any questions or concerns about baby.  EPDS score is 1.  Marcelino Duster [facility]  10/29/21,1150

## 2021-12-01 ENCOUNTER — Ambulatory Visit: Payer: Medicaid Other | Admitting: Student

## 2022-07-14 ENCOUNTER — Emergency Department (HOSPITAL_COMMUNITY)
Admission: EM | Admit: 2022-07-14 | Discharge: 2022-07-14 | Disposition: A | Discharge status: Home / Self Care | Payer: Medicaid Other | Attending: Emergency Medicine | Admitting: Emergency Medicine

## 2022-07-14 ENCOUNTER — Other Ambulatory Visit: Payer: Self-pay

## 2022-07-14 ENCOUNTER — Encounter (HOSPITAL_COMMUNITY): Payer: Self-pay | Admitting: Emergency Medicine

## 2022-07-14 DIAGNOSIS — H538 Other visual disturbances: Secondary | ICD-10-CM | POA: Diagnosis not present

## 2022-07-14 DIAGNOSIS — H5713 Ocular pain, bilateral: Secondary | ICD-10-CM | POA: Diagnosis not present

## 2022-07-14 DIAGNOSIS — H5712 Ocular pain, left eye: Secondary | ICD-10-CM | POA: Insufficient documentation

## 2022-07-14 DIAGNOSIS — L299 Pruritus, unspecified: Secondary | ICD-10-CM | POA: Diagnosis not present

## 2022-07-14 DIAGNOSIS — H5710 Ocular pain, unspecified eye: Secondary | ICD-10-CM | POA: Diagnosis not present

## 2022-07-14 MED ORDER — TETRACAINE HCL 0.5 % OP SOLN
2.0000 [drp] | Freq: Once | OPHTHALMIC | Status: AC
  Administered 2022-07-14: 2 [drp] via OPHTHALMIC
  Filled 2022-07-14: qty 4

## 2022-07-14 MED ORDER — FLUORESCEIN SODIUM 1 MG OP STRP
1.0000 | ORAL_STRIP | Freq: Once | OPHTHALMIC | Status: AC
  Administered 2022-07-14: 1 via OPHTHALMIC
  Filled 2022-07-14: qty 1

## 2022-07-14 NOTE — ED Provider Notes (Signed)
[place] [facility] AT [facility] Provider Note   CSN: [id number] Arrival date & time: 07/14/22  4098     History  Chief Complaint  Patient presents with   Eye Pain    Madison Soto is a 27 y.o. female.  Patient with no pertinent past medical history presents today with complaints of left eye pain. She states that same began yesterday afternoon when she was fixing her hair. She initially noticed just some irritation in the eye and itching. Denies any known trauma. States that this morning she woke up with pain in her eye with painful eye movements as well as blurred vision. She states that same has been persistent since onset. She is also extremely light sensitive. Denies any history of similar symptoms previously. She does not wear contact lenses. She denies any symptoms in her right eye. Denies any drainage. Denies headache, dizziness, nausea, or vomiting. Denies any recent medication changes.   The history is provided by the patient. No language interpreter was used.  Eye Pain       Home Medications Prior to Admission medications   Medication Sig Start Date End Date Taking? Authorizing Provider  acetaminophen (TYLENOL) 325 MG tablet Take 2 tablets (650 mg total) by mouth every 6 (six) hours. Patient not taking: Reported on 07/22/2021 05/21/20   Maury Dus, MD  acetaminophen (TYLENOL) 325 MG tablet Take 2 tablets (650 mg total) by mouth every 6 (six) hours as needed (for pain scale < 4). 10/23/21   Myna Hidalgo, DO  Blood Pressure Monitoring (BLOOD PRESSURE MONITOR AUTOMAT) DEVI 1 Device by Does not apply route daily. Automatic blood pressure cuff regular size. To monitor blood pressure regularly at home. ICD-10 code:Z34.90 Patient not taking: Reported on 06/30/2021 03/18/20   Raelyn Mora, CNM  ibuprofen (ADVIL) 600 MG tablet Take 1 tablet (600 mg total) by mouth every 6 (six) hours. 10/23/21   Myna Hidalgo, DO  prenatal vitamin w/FE, FA (PRENATAL 1  + 1) 27-1 MG TABS tablet Take 1 tablet by mouth daily at 12 noon. Patient not taking: Reported on 10/13/2021 05/18/21   Levie Heritage, DO      Allergies    Patient has no known allergies.    Review of Systems   Review of Systems  Eyes:  Positive for pain and redness.  All other systems reviewed and are negative.   Physical Exam Updated Vital Signs BP 112/77 (BP Location: Left Arm)   Pulse 61   Temp 98 F (36.7 C) (Oral)   Resp 20   Ht 5\' 5"  (1.651 m)   Wt 68 kg   SpO2 100%   BMI 24.96 kg/m  Physical Exam Vitals and nursing note reviewed.  Constitutional:      General: She is not in acute distress.    Appearance: Normal appearance. She is normal weight. She is not ill-appearing, toxic-appearing or diaphoretic.  HENT:     Head: Normocephalic and atraumatic.  Eyes:     Extraocular Movements: Extraocular movements intact.     Pupils: Pupils are equal, round, and reactive to light.     Comments: EOMs intact with some pain. Conjunctival erythema noted. PERRLA.  Fluorescein stain with possible hazy uptake over the pupil but is very faint.  No obvious abrasion or ulceration. Tonopen performed 3 times, not well tolerated. Readings were 45, 30, and 25. No obvious exopthalmos. Significant light sensitivity. Does have some improvement in pain with administration of tetracaine drops  Cardiovascular:  Rate and Rhythm: Normal rate.  Pulmonary:     Effort: Pulmonary effort is normal. No respiratory distress.  Musculoskeletal:        General: Normal range of motion.     Cervical back: Normal range of motion.  Skin:    General: Skin is warm and dry.  Neurological:     General: No focal deficit present.     Mental Status: She is alert and oriented to person, place, and time.  Psychiatric:        Mood and Affect: Mood normal.        Behavior: Behavior normal.     ED Results / Procedures / Treatments   Labs (all labs ordered are listed, but only abnormal results are  displayed) Labs Reviewed - No data to display  EKG None  Radiology No results found.  Procedures Procedures    Medications Ordered in ED Medications  fluorescein ophthalmic strip 1 strip (1 strip Left Eye Given 07/14/22 1157)  tetracaine (PONTOCAINE) 0.5 % ophthalmic solution 2 drop (2 drops Left Eye Given 07/14/22 1157)    ED Course/ Medical Decision Making/ A&P                             Medical Decision Making Risk Prescription drug management.   Patient presents today with complaints of left eye pain and vision changes. She is in obvious discomfort but is non-toxic appearing, and in no acute distress with reassuring vital signs.  She is also alert and oriented and neurologically intact.  Physical exam of the left eye does show some conjunctival erythema and possibly a hazy uptake of fluorescein stain with Woods lamp, however is very faint in nature.  There is no obvious ulceration or abrasion.  Patient does have marked vision changes with visual acuity 20/20 in the right eye and 20/125 in the left eye. Her pupils are equal and reactive.  EOMs are intact but with pain. No exopthalmos. Elevated eye pressure readings noted as well, although exam was not well tolerated. Given these symptoms with vision changes, I did consult ophthalmology Dr. Randon Goldsmith who requests to see the patient in office ASAP for evaluation. Discussed with patient who is calling a ride to come get her.    2:00PM: patient informed staff that her ride is here to take her to ophthalmology. Patient provided with Dr. Karie Soda office information and given a number to call as well. She agrees to go straight to ophthalmology's office for evaluation.  Evaluation and diagnostic testing in the emergency department does not suggest an emergent condition requiring admission or immediate intervention beyond what has been performed at this time.  Plan for discharge with close PCP follow-up.  Patient is understanding and amenable  with plan, educated on red flag symptoms that would prompt immediate return.  Patient discharged in stable condition.  Findings and plan of care discussed with supervising physician Dr. Lockie Mola who is in agreement.    Final Clinical Impression(s) / ED Diagnoses Final diagnoses:  Pain of left eye  Blurred vision, left eye    Rx / DC Orders ED Discharge Orders     None     An After Visit Summary was printed and given to the patient.     Vear Clock 07/14/22 1537    Virgina Norfolk, DO 07/14/22 1609

## 2022-07-14 NOTE — Discharge Instructions (Addendum)
I have talked to ophthalmology and discussed your symptoms with them.  They would like to see you in the office ASAP and are expecting you.  Their information is listed below.  Dr. Antony Contras [facility] [address] [place], [place] [address]  Return to the emergency department if development of any new or worsening symptoms.
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
## 2022-07-17 DIAGNOSIS — H16102 Unspecified superficial keratitis, left eye: Secondary | ICD-10-CM | POA: Diagnosis not present

## 2022-08-18 DIAGNOSIS — Z01419 Encounter for gynecological examination (general) (routine) without abnormal findings: Secondary | ICD-10-CM | POA: Diagnosis not present

## 2022-08-18 DIAGNOSIS — Z3202 Encounter for pregnancy test, result negative: Secondary | ICD-10-CM | POA: Diagnosis not present

## 2022-08-18 DIAGNOSIS — N760 Acute vaginitis: Secondary | ICD-10-CM | POA: Diagnosis not present

## 2022-08-18 DIAGNOSIS — B3731 Acute candidiasis of vulva and vagina: Secondary | ICD-10-CM | POA: Diagnosis not present

## 2022-08-18 DIAGNOSIS — Z3009 Encounter for other general counseling and advice on contraception: Secondary | ICD-10-CM | POA: Diagnosis not present

## 2022-08-18 DIAGNOSIS — R87612 Low grade squamous intraepithelial lesion on cytologic smear of cervix (LGSIL): Secondary | ICD-10-CM | POA: Diagnosis not present

## 2022-08-18 DIAGNOSIS — Z30013 Encounter for initial prescription of injectable contraceptive: Secondary | ICD-10-CM | POA: Diagnosis not present

## 2022-10-05 ENCOUNTER — Encounter (HOSPITAL_COMMUNITY): Payer: Self-pay

## 2022-10-05 ENCOUNTER — Ambulatory Visit (HOSPITAL_COMMUNITY)
Admission: EM | Admit: 2022-10-05 | Discharge: 2022-10-05 | Disposition: A | Discharge status: Home / Self Care | Payer: Medicaid Other

## 2022-10-05 DIAGNOSIS — N764 Abscess of vulva: Secondary | ICD-10-CM

## 2022-10-05 MED ORDER — SULFAMETHOXAZOLE-TRIMETHOPRIM 800-160 MG PO TABS: 1.0000 | ORAL_TABLET | Freq: Two times a day (BID) | ORAL | 0 refills | Status: DC

## 2022-10-05 MED ORDER — LIDOCAINE-EPINEPHRINE-TETRACAINE (LET) TOPICAL GEL
TOPICAL | Status: AC
  Filled 2022-10-05: qty 3

## 2022-10-05 NOTE — ED Provider Notes (Signed)
[facility]    CSN: [id number] Arrival date & time: 10/05/22  1352      History   Chief Complaint Chief Complaint  Patient presents with   Abscess    HPI Madison Soto is a 27 y.o. female.   HPI Patient here with a left labia abscess is hard and painful.  This is a recurrent problem patient has been seen here at urgent care previously twice for recurrent abscess of the left labia.  She reports current flareup has been present for 3 days.  She is afebrile.  Denies any nausea vomiting.  She is uncertain as to why this problem continues to reoccur.  Past Medical History:  Diagnosis Date   Asthma    childhood. patient was told she "grew out of it'    Patient Active Problem List   Diagnosis Date Noted   Indication for care in labor and delivery, antepartum 10/22/2021   Supervision of other normal pregnancy, antepartum 05/18/2021   Indication for care or intervention in labor or delivery 05/19/2020   Marijuana use 05/19/2020   Spontaneous vaginal delivery 08/03/2016    Past Surgical History:  Procedure Laterality Date   NO PAST SURGERIES      OB History     Gravida  3   Para  3   Term  3   Preterm      AB      Living  3      SAB      IAB      Ectopic      Multiple  0   Live Births  3            Home Medications    Prior to Admission medications   Medication Sig Start Date End Date Taking? Authorizing Provider  acetaminophen (TYLENOL) 325 MG tablet Take 2 tablets (650 mg total) by mouth every 6 (six) hours. 05/21/20  Yes Maury Dus, MD  medroxyPROGESTERone (DEPO-PROVERA) 150 MG/ML injection Inject 150 mg into the muscle every 3 (three) months.   Yes [provider]  sulfamethoxazole-trimethoprim (BACTRIM DS) 800-160 MG tablet Take 1 tablet by mouth 2 (two) times daily. 10/05/22  Yes Bing Neighbors, NP  acetaminophen (TYLENOL) 325 MG tablet Take 2 tablets (650 mg total) by mouth every 6 (six) hours as needed (for pain  scale < 4). 10/23/21   Myna Hidalgo, DO  Blood Pressure Monitoring (BLOOD PRESSURE MONITOR AUTOMAT) DEVI 1 Device by Does not apply route daily. Automatic blood pressure cuff regular size. To monitor blood pressure regularly at home. ICD-10 code:Z34.90 Patient not taking: Reported on 06/30/2021 03/18/20   Raelyn Mora, CNM  ibuprofen (ADVIL) 600 MG tablet Take 1 tablet (600 mg total) by mouth every 6 (six) hours. 10/23/21   Myna Hidalgo, DO  prenatal vitamin w/FE, FA (PRENATAL 1 + 1) 27-1 MG TABS tablet Take 1 tablet by mouth daily at 12 noon. Patient not taking: Reported on 10/13/2021 05/18/21   Levie Heritage, DO    Family History Family History  Problem Relation Age of Onset   Healthy Mother    Healthy Father    Hypertension Paternal Grandmother    Hypertension Paternal Grandfather     Social History Social History   Tobacco Use   Smoking status: Former    Types: Cigars   Smokeless tobacco: Never  Vaping Use   Vaping status: Never Used  Substance Use Topics   Alcohol use: No   Drug use: Not Currently  Types: Marijuana    Comment: last smoked in July 2023     Allergies   Patient has no known allergies.   Review of Systems Review of Systems Pertinent negatives listed in HPI   Physical Exam Triage Vital Signs ED Triage Vitals  Encounter Vitals Group     BP 10/05/22 1519 109/63     Systolic BP Percentile --      Diastolic BP Percentile --      Pulse Rate 10/05/22 1519 62     Resp 10/05/22 1519 18     Temp 10/05/22 1519 98.2 F (36.8 C)     Temp Source 10/05/22 1519 Oral     SpO2 10/05/22 1519 98 %     Weight --      Height --      Head Circumference --      Peak Flow --      Pain Score 10/05/22 1520 9     Pain Loc --      Pain Education --      Exclude from Growth Chart --    No data found.  Updated Vital Signs BP 109/63 (BP Location: Left Arm)   Pulse 62   Temp 98.2 F (36.8 C) (Oral)   Resp 18   LMP 10/04/2022 (Exact Date)   SpO2 98%    Visual Acuity Right Eye Distance:   Left Eye Distance:   Bilateral Distance:    Right Eye Near:   Left Eye Near:    Bilateral Near:     Physical Exam Vitals reviewed. Exam conducted with a chaperone present.  Constitutional:      Appearance: Normal appearance.  HENT:     Head: Normocephalic and atraumatic.  Eyes:     Extraocular Movements: Extraocular movements intact.     Pupils: Pupils are equal, round, and reactive to light.  Cardiovascular:     Rate and Rhythm: Normal rate and regular rhythm.  Genitourinary:    Labia:        Left: Lesion present.      Comments: Left labile abscess  Musculoskeletal:     Cervical back: Normal range of motion and neck supple.  Skin:    General: Skin is warm and dry.     Capillary Refill: Capillary refill takes less than 2 seconds.  Neurological:     Mental Status: She is alert.      UC Treatments / Results  Labs (all labs ordered are listed, but only abnormal results are displayed) Labs Reviewed - No data to display  EKG   Radiology No results found.  Procedures Incision and Drainage  Date/Time: 10/05/2022 3:50 PM  Performed by: Bing Neighbors, NP Authorized by: Bing Neighbors, NP   Consent:    Consent obtained:  Verbal   Consent given by:  Patient   Risks discussed:  Incomplete drainage   Alternatives discussed:  No treatment Universal protocol:    Patient identity confirmed:  Verbally with patient Location:    Type:  Abscess   Location:  Anogenital   Anogenital location:  Vulva Pre-procedure details:    Skin preparation:  Povidone-iodine Sedation:    Sedation type:  None Anesthesia:    Anesthesia method:  Topical application and local infiltration   Topical anesthetic:  LET   Local anesthetic:  Lidocaine 1% WITH epi Procedure type:    Complexity:  Complex Procedure details:    Ultrasound guidance: no     Incision types:  Single straight  Incision depth:  Dermal   Wound management:  Irrigated  with saline   Drainage:  Purulent   Drainage amount:  Moderate   Wound treatment:  Wound left open Post-procedure details:    Procedure completion:  Tolerated  (including critical care time)  Medications Ordered in UC Medications - No data to display  Initial Impression / Assessment and Plan / UC Course  I have reviewed the triage vital signs and the nursing notes.  Pertinent labs & imaging results that were available during my care of the patient were reviewed by me and considered in my medical decision making (see chart for details).    I&D performed, patient tolerated. Wound care instructions discussed with patient.  Antibiotic treatment with Bactrim twice daily for 10 days.  Strict return precautions given if symptoms worsen or do not improve. Final Clinical Impressions(s) / UC Diagnoses   Final diagnoses:  Left genital labial abscess     Discharge Instructions      Take Bactrim twice daily for 10 day and complete the entire course of treatment. Leave bandage in place for at least 4 hours and shower and re-dress with bandage.     ED Prescriptions     Medication Sig Dispense Auth. Provider   sulfamethoxazole-trimethoprim (BACTRIM DS) 800-160 MG tablet Take 1 tablet by mouth 2 (two) times daily. 20 tablet Bing Neighbors, NP      PDMP not reviewed this encounter.   Bing Neighbors, NP 10/05/22 [phone]

## 2022-10-05 NOTE — Discharge Instructions (Addendum)
Take Bactrim twice daily for 10 day and complete the entire course of treatment. Leave bandage in place for at least 4 hours and shower and re-dress with bandage.

## 2022-10-05 NOTE — ED Triage Notes (Signed)
Pt reports she has an "abscess" on her groin area x 3 days  States it is painful

## 2022-11-17 DIAGNOSIS — Z3009 Encounter for other general counseling and advice on contraception: Secondary | ICD-10-CM | POA: Diagnosis not present

## 2022-11-17 DIAGNOSIS — Z3042 Encounter for surveillance of injectable contraceptive: Secondary | ICD-10-CM | POA: Diagnosis not present

## 2024-01-24 ENCOUNTER — Other Ambulatory Visit: Payer: Self-pay

## 2024-01-24 ENCOUNTER — Encounter (HOSPITAL_BASED_OUTPATIENT_CLINIC_OR_DEPARTMENT_OTHER): Payer: Self-pay | Admitting: Emergency Medicine

## 2024-01-24 ENCOUNTER — Emergency Department (HOSPITAL_BASED_OUTPATIENT_CLINIC_OR_DEPARTMENT_OTHER)
Admission: EM | Admit: 2024-01-24 | Discharge: 2024-01-24 | Disposition: A | Discharge status: Home / Self Care | Attending: Emergency Medicine | Admitting: Emergency Medicine

## 2024-01-24 DIAGNOSIS — T310 Burns involving less than 10% of body surface: Secondary | ICD-10-CM | POA: Diagnosis not present

## 2024-01-24 DIAGNOSIS — T2056XA Corrosion of first degree of forehead and cheek, initial encounter: Secondary | ICD-10-CM | POA: Insufficient documentation

## 2024-01-24 DIAGNOSIS — T543X1A Toxic effect of corrosive alkalis and alkali-like substances, accidental (unintentional), initial encounter: Secondary | ICD-10-CM | POA: Diagnosis present

## 2024-01-24 DIAGNOSIS — T2040XA Corrosion of unspecified degree of head, face, and neck, unspecified site, initial encounter: Secondary | ICD-10-CM

## 2024-01-24 DIAGNOSIS — T2054XA Corrosion of first degree of nose (septum), initial encounter: Secondary | ICD-10-CM | POA: Diagnosis not present

## 2024-01-24 DIAGNOSIS — Y9289 Other specified places as the place of occurrence of the external cause: Secondary | ICD-10-CM | POA: Diagnosis not present

## 2024-01-24 DIAGNOSIS — X58XXXA Exposure to other specified factors, initial encounter: Secondary | ICD-10-CM | POA: Diagnosis not present

## 2024-01-24 DIAGNOSIS — H109 Unspecified conjunctivitis: Secondary | ICD-10-CM

## 2024-01-24 MED ORDER — BACITRACIN ZINC 500 UNIT/GM EX OINT
TOPICAL_OINTMENT | Freq: Two times a day (BID) | CUTANEOUS | Status: AC
Start: 2024-01-24 — End: 2024-01-24
  Administered 2024-01-24: 1 via TOPICAL

## 2024-01-24 MED ORDER — BACITRACIN ZINC 500 UNIT/GM EX OINT: 1.0000 | TOPICAL_OINTMENT | Freq: Two times a day (BID) | CUTANEOUS | 0 refills | Status: DC

## 2024-01-24 MED ORDER — ERYTHROMYCIN 5 MG/GM OP OINT: 1.0000 | TOPICAL_OINTMENT | Freq: Four times a day (QID) | OPHTHALMIC | 0 refills | Status: AC

## 2024-01-24 MED ORDER — ERYTHROMYCIN 5 MG/GM OP OINT
TOPICAL_OINTMENT | Freq: Once | OPHTHALMIC | Status: AC
Start: 2024-01-24 — End: 2024-01-24
  Filled 2024-01-24: qty 3.5

## 2024-01-24 MED ORDER — TETRACAINE HCL 0.5 % OP SOLN
1.0000 [drp] | Freq: Once | OPHTHALMIC | Status: AC
  Administered 2024-01-24: 1 [drp] via OPHTHALMIC
  Filled 2024-01-24: qty 4

## 2024-01-24 MED ORDER — FLUORESCEIN SODIUM 1 MG OP STRP
1.0000 | ORAL_STRIP | Freq: Once | OPHTHALMIC | Status: AC
Start: 2024-01-24 — End: 2024-01-24
  Administered 2024-01-24: 1 via OPHTHALMIC
  Filled 2024-01-24: qty 1
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
## 2024-01-24 NOTE — ED Notes (Signed)
fluorescein ophthalmic strip at bedside
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
## 2024-02-04 ENCOUNTER — Encounter (HOSPITAL_BASED_OUTPATIENT_CLINIC_OR_DEPARTMENT_OTHER): Payer: Self-pay

## 2024-02-04 ENCOUNTER — Emergency Department (HOSPITAL_BASED_OUTPATIENT_CLINIC_OR_DEPARTMENT_OTHER)
Admission: EM | Admit: 2024-02-04 | Discharge: 2024-02-04 | Disposition: A | Discharge status: Home / Self Care | Payer: Self-pay | Attending: Emergency Medicine | Admitting: Emergency Medicine

## 2024-02-04 DIAGNOSIS — R238 Other skin changes: Secondary | ICD-10-CM

## 2024-02-04 MED ORDER — CHLORHEXIDINE GLUCONATE 2 % EX SOLN: 1.0000 | Freq: Every day | CUTANEOUS | 0 refills | Status: DC

## 2024-02-04 NOTE — ED Triage Notes (Signed)
 Pt to exam 12. Pt states she has biofilm coming out of her pores Pt a/o x 4 denies SI or HI. PT skin appears dry intact without lesions or abrasions.  VSS NAD PT on room air.
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
## 2024-02-06 ENCOUNTER — Emergency Department (HOSPITAL_COMMUNITY)
Admission: EM | Admit: 2024-02-06 | Discharge: 2024-02-06 | Disposition: A | Discharge status: Home / Self Care | Payer: Self-pay

## 2024-02-06 ENCOUNTER — Encounter (HOSPITAL_COMMUNITY): Payer: Self-pay | Admitting: Emergency Medicine

## 2024-02-06 ENCOUNTER — Other Ambulatory Visit: Payer: Self-pay

## 2024-02-06 ENCOUNTER — Ambulatory Visit (HOSPITAL_COMMUNITY)
Admission: EM | Admit: 2024-02-06 | Discharge: 2024-02-07 | Disposition: A | Discharge status: Other Acute Inpatient Hospital

## 2024-02-06 DIAGNOSIS — F16959 Hallucinogen use, unspecified with hallucinogen-induced psychotic disorder, unspecified: Secondary | ICD-10-CM | POA: Insufficient documentation

## 2024-02-06 DIAGNOSIS — F19951 Other psychoactive substance use, unspecified with psychoactive substance-induced psychotic disorder with hallucinations: Secondary | ICD-10-CM | POA: Diagnosis present

## 2024-02-06 DIAGNOSIS — L299 Pruritus, unspecified: Secondary | ICD-10-CM | POA: Insufficient documentation

## 2024-02-06 LAB — CBC
HCT: 38.7 % (ref 36.0–46.0)
Hemoglobin: 12.8 g/dL (ref 12.0–15.0)
MCH: 29.5 pg (ref 26.0–34.0)
MCHC: 33.1 g/dL (ref 30.0–36.0)
MCV: 89.2 fL (ref 80.0–100.0)
Platelets: 435 K/uL — ABNORMAL HIGH (ref 150–400)
RBC: 4.34 MIL/uL (ref 3.87–5.11)
RDW: 14.8 % (ref 11.5–15.5)
WBC: 6.7 K/uL (ref 4.0–10.5)
nRBC: 0 % (ref 0.0–0.2)

## 2024-02-06 LAB — HEPATIC FUNCTION PANEL
ALT: 19 U/L (ref 0–44)
AST: 22 U/L (ref 15–41)
Albumin: 3.8 g/dL (ref 3.5–5.0)
Alkaline Phosphatase: 42 U/L (ref 38–126)
Bilirubin, Direct: <0.1 mg/dL (ref 0.0–0.2)
Total Bilirubin: 0.4 mg/dL (ref 0.0–1.2)
Total Protein: 7.1 g/dL (ref 6.5–8.1)

## 2024-02-06 LAB — POCT URINE DRUG SCREEN - MANUAL ENTRY (I-SCREEN)
POC Amphetamine UR: POSITIVE — AB
POC Buprenorphine (BUP): NOT DETECTED
POC Cocaine UR: NOT DETECTED
POC Marijuana UR: POSITIVE — AB
POC Methadone UR: NOT DETECTED
POC Methamphetamine UR: POSITIVE — AB
POC Morphine: NOT DETECTED
POC Oxazepam (BZO): NOT DETECTED
POC Oxycodone UR: NOT DETECTED
POC Secobarbital (BAR): NOT DETECTED

## 2024-02-06 LAB — BASIC METABOLIC PANEL WITH GFR
Anion gap: 9 (ref 5–15)
BUN: 12 mg/dL (ref 6–20)
CO2: 24 mmol/L (ref 22–32)
Calcium: 9.3 mg/dL (ref 8.9–10.3)
Chloride: 105 mmol/L (ref 98–111)
Creatinine, Ser: 0.73 mg/dL (ref 0.44–1.00)
GFR, Estimated: >60 mL/min (ref >60)
Glucose, Bld: 92 mg/dL (ref 70–99)
Potassium: 3.9 mmol/L (ref 3.5–5.1)
Sodium: 138 mmol/L (ref 135–145)

## 2024-02-06 LAB — SEDIMENTATION RATE: Sed Rate: 4 mm/h (ref 0–22)

## 2024-02-06 LAB — TSH: TSH: 1.166 u[IU]/mL (ref 0.350–4.500)

## 2024-02-06 LAB — C-REACTIVE PROTEIN: CRP: <0.5 mg/dL (ref <1.0)

## 2024-02-06 LAB — ETHANOL: Alcohol, Ethyl (B): <15 mg/dL (ref <15)

## 2024-02-06 MED ORDER — HYDROXYZINE HCL 25 MG PO TABS: 25.0000 mg | ORAL_TABLET | Freq: Three times a day (TID) | ORAL | Status: DC | PRN

## 2024-02-06 MED ORDER — DIPHENHYDRAMINE HCL 50 MG/ML IJ SOLN: 50.0000 mg | Freq: Three times a day (TID) | INTRAMUSCULAR | Status: DC | PRN

## 2024-02-06 MED ORDER — PERMETHRIN 5 % EX CREA
1.0000 | TOPICAL_CREAM | Freq: Once | CUTANEOUS | Status: AC
  Administered 2024-02-06: 1 via TOPICAL
  Filled 2024-02-06: qty 60

## 2024-02-06 MED ORDER — HALOPERIDOL LACTATE 5 MG/ML IJ SOLN: 5.0000 mg | Freq: Three times a day (TID) | INTRAMUSCULAR | Status: DC | PRN

## 2024-02-06 MED ORDER — ALUM & MAG HYDROXIDE-SIMETH 200-200-20 MG/5ML PO SUSP: 30.0000 mL | ORAL | Status: DC | PRN

## 2024-02-06 MED ORDER — OLANZAPINE 2.5 MG PO TABS
1.2500 mg | ORAL_TABLET | Freq: Every day | ORAL | Status: DC
  Administered 2024-02-06 – 2024-02-07 (×2): 1.25 mg via ORAL
  Filled 2024-02-06 (×2): qty 1

## 2024-02-06 MED ORDER — HALOPERIDOL 5 MG PO TABS: 5.0000 mg | ORAL_TABLET | Freq: Three times a day (TID) | ORAL | Status: DC | PRN

## 2024-02-06 MED ORDER — HYDROXYZINE HCL 25 MG PO TABS: 25.0000 mg | ORAL_TABLET | Freq: Four times a day (QID) | ORAL | 0 refills | Status: DC

## 2024-02-06 MED ORDER — TRIAMCINOLONE ACETONIDE 0.1 % EX CREA: 1.0000 | TOPICAL_CREAM | Freq: Two times a day (BID) | CUTANEOUS | 0 refills | Status: DC

## 2024-02-06 MED ORDER — LORAZEPAM 2 MG/ML IJ SOLN: 2.0000 mg | Freq: Three times a day (TID) | INTRAMUSCULAR | Status: DC | PRN

## 2024-02-06 MED ORDER — OLANZAPINE 2.5 MG PO TABS: 2.5000 mg | ORAL_TABLET | Freq: Every day | ORAL | Status: DC

## 2024-02-06 MED ORDER — ACETAMINOPHEN 325 MG PO TABS: 650.0000 mg | ORAL_TABLET | Freq: Four times a day (QID) | ORAL | Status: DC | PRN

## 2024-02-06 MED ORDER — PERMETHRIN 5 % EX CREA: TOPICAL_CREAM | CUTANEOUS | 0 refills | Status: DC

## 2024-02-06 MED ORDER — NICOTINE 14 MG/24HR TD PT24: 14.0000 mg | MEDICATED_PATCH | Freq: Every day | TRANSDERMAL | Status: DC

## 2024-02-06 MED ORDER — TRAZODONE HCL 50 MG PO TABS: 50.0000 mg | ORAL_TABLET | Freq: Every evening | ORAL | Status: DC | PRN

## 2024-02-06 MED ORDER — HALOPERIDOL LACTATE 5 MG/ML IJ SOLN: 10.0000 mg | Freq: Three times a day (TID) | INTRAMUSCULAR | Status: DC | PRN

## 2024-02-06 MED ORDER — DIPHENHYDRAMINE HCL 50 MG PO CAPS: 50.0000 mg | ORAL_CAPSULE | Freq: Three times a day (TID) | ORAL | Status: DC | PRN

## 2024-02-06 MED ORDER — MAGNESIUM HYDROXIDE 400 MG/5ML PO SUSP: 30.0000 mL | Freq: Every day | ORAL | Status: DC | PRN
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
## 2024-02-06 NOTE — ED Provider Notes (Signed)
 [place] EMERGENCY DEPARTMENT AT [facility] Provider Note   CSN: [id number] Arrival date & time: 02/06/24  9357     Patient presents with: Pruritis   Madison Soto is a 28 y.o. female.   28 year old female with no reported past medical history presenting to the emergency department today with itching.  The patient states that she has been itching now for the past few weeks.  She has been seen in the ER twice for this.  She states she has tried multiple soaps and over-the-counter remedies with no improvement.  States that she is primarily itching now between her fingers on her hands and in her groin area.  She denies any fevers.  Denies any obvious known exposures or known history of any bedbugs.  She came to the emergency department today for further evaluation regarding this.  She states this is making her anxious.  Denies any suicidal or homicidal ideations.        Prior to Admission medications   Medication Sig Start Date End Date Taking? Authorizing Provider  hydrOXYzine (ATARAX) 25 MG tablet Take 1 tablet (25 mg total) by mouth every 6 (six) hours. 02/06/24  Yes Ula Prentice SAUNDERS, MD  permethrin  (ELIMITE ) 5 % cream Apply to affected area once 02/06/24  Yes Ula Prentice SAUNDERS, MD  triamcinolone  cream (KENALOG ) 0.1 % Apply 1 Application topically 2 (two) times daily. 02/06/24  Yes Ula Prentice SAUNDERS, MD  acetaminophen  (TYLENOL ) 325 MG tablet Take 2 tablets (650 mg total) by mouth every 6 (six) hours. 05/21/20   Malvina Ellen, MD  acetaminophen  (TYLENOL ) 325 MG tablet Take 2 tablets (650 mg total) by mouth every 6 (six) hours as needed (for pain scale < 4). 10/23/21   Ozan, Jennifer, DO  bacitracin  ointment Apply 1 Application topically 2 (two) times daily. 01/24/24   Suellen Cantor A, PA-C  Blood Pressure Monitoring (BLOOD PRESSURE MONITOR AUTOMAT) DEVI 1 Device by Does not apply route daily. Automatic blood pressure cuff regular size. To monitor blood pressure regularly at home.  ICD-10 code:Z34.90 Patient not taking: Reported on 06/30/2021 03/18/20   Dawson, Rolitta, CNM  Chlorhexidine  Gluconate 2 % SOLN Apply 1 Application topically daily for 7 days. 02/04/24 02/11/24  Mesner, Selinda, MD  ibuprofen  (ADVIL ) 600 MG tablet Take 1 tablet (600 mg total) by mouth every 6 (six) hours. 10/23/21   Ozan, Jennifer, DO  medroxyPROGESTERone  (DEPO-PROVERA ) 150 MG/ML injection Inject 150 mg into the muscle every 3 (three) months.    [provider]  prenatal vitamin w/FE, FA (PRENATAL 1 + 1) 27-1 MG TABS tablet Take 1 tablet by mouth daily at 12 noon. Patient not taking: Reported on 10/13/2021 05/18/21   Stinson, Jacob J, DO  sulfamethoxazole -trimethoprim  (BACTRIM  DS) 800-160 MG tablet Take 1 tablet by mouth 2 (two) times daily. 10/05/22   Arloa Suzen GORMAN, NP    Allergies: Patient has no known allergies.    Review of Systems  Skin:        Itching/rash  All other systems reviewed and are negative.   Updated Vital Signs BP (!) 150/97 (BP Location: Right Arm)   Pulse 86   Temp 98.3 F (36.8 C) (Oral)   Resp 20   LMP 01/28/2024 (Exact Date)   SpO2 96%   Physical Exam Vitals and nursing note reviewed.   Gen: NAD Eyes: PERRL, EOMI HEENT: no oropharyngeal swelling Neck: trachea midline Resp: clear to auscultation bilaterally Card: RRR, no murmurs, rubs, or gallops Abd: nontender, nondistended GU: Deferred  by patient Extremities: no calf tenderness, no edema Vascular: 2+ radial pulses bilaterally, 2+ DP pulses bilaterally Skin: The patient does have some signs of excoriation over her nose bridge but this does appear to be improved compared to the photo in the chart from last month.  There are some erythema and signs of excoriation in the intertriginous areas but no obvious papules or vesicular lesions noted, no rash noted over the palmar area of the hands noted Psyc: acting appropriately   (all labs ordered are listed, but only abnormal results are displayed) Labs  Reviewed  CBC - Abnormal; Notable for the following components:      Result Value   Platelets 435 (*)    All other components within normal limits  BASIC METABOLIC PANEL WITH GFR  HEPATIC FUNCTION PANEL    EKG: None  Radiology: No results found.   Procedures   Medications Ordered in the ED - No data to display                                  Medical Decision Making 28 year old female with no reported past medical history presenting to the emergency department today with itchy rash.  The patient does have some signs of excoriation between her fingers but no palmar lesions.  With her having the intertriginous itching and some erythema we will treat her for scabies.  Will also give her hydroxyzine as well as topical steroid cream to use as needed.  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  Will also obtain LFTs in addition to the basic labs she has already had to evaluate for liver dysfunction although suspicion for this is relatively low.  I think that she may be discharged with return precautions.  The patient's liver function tests are unremarkable.  She will be discharged with return precautions.  Amount and/or Complexity of Data Reviewed Labs: ordered.  Risk Prescription drug management.        Final diagnoses:  Pruritus    ED Discharge Orders          Ordered    permethrin  (ELIMITE ) 5 % cream        02/06/24 0906    triamcinolone  cream (KENALOG ) 0.1 %  2 times daily        02/06/24 0906    hydrOXYzine (ATARAX) 25 MG tablet  Every 6 hours        02/06/24 0906               Ula Prentice SAUNDERS, MD 02/06/24 [phone]

## 2024-02-06 NOTE — ED Notes (Signed)
 Permethrine cream applied to body.

## 2024-02-06 NOTE — ED Notes (Signed)
 Pt resting at present,  easily arouseable to verbal stimuli.  Monitoring for safety.

## 2024-02-06 NOTE — ED Notes (Signed)
 Pt sleeping at present, no distress noted.  Monitoring for safety.

## 2024-02-06 NOTE — ED Notes (Signed)
 Pt is currently sleeping, no distress noted, environmental check complete, will continue to monitor patient for safety.

## 2024-02-06 NOTE — ED Notes (Signed)
 Patient presented to facility with suicidal ideations, patient was recently seen and treated at the ED for c/o itching. Patients lab work, urine drug screen/ urinalysis, and EKG has been completed and labs have been sent to [facility] lab for resulting.Patient was tearful during EKG and skin check as she says that the itching and rash is all over and causing her hair to fall out. Patient has been brought on unit, familiarized with unit, food and medication has been provided.
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
## 2024-02-06 NOTE — ED Triage Notes (Addendum)
 Patient here with complaints of itching to her face, bilateral hands and groin area since thanksgiving. Patient reports this has been ongoing since Thanksgiving. Patient tearful in triage stating that she feels like everyone is laughing at her. She believes she has an underlying condition. Has tried topical creams (shea butter, Dr. Ferdinand, castor soap, etc with no relief. Denies SI/ HI currently. She is becoming irritated stating I think I have excessive skin coming out of my pores.

## 2024-02-06 NOTE — Discharge Instructions (Addendum)
 I think you may have scabies.  Please try the permethrin  cream.  Apply this from the neck down and leave on for 8 to 12 hours before washing off.  If this is not successful you may retry this in 1 to 2 weeks.  Please wash all of your close and linens in hot water .  You may take the hydroxyzine as needed for itching.  Use the steroid cream on your hands and between your fingers for the next few days.  Do not put this on your face.  Your lab work was reassuring today.  Return to the ER for worsening symptoms.

## 2024-02-07 ENCOUNTER — Other Ambulatory Visit (HOSPITAL_COMMUNITY)
Admission: EM | Admit: 2024-02-07 | Discharge: 2024-02-13 | Disposition: A | Source: Intra-hospital | Attending: Psychiatry | Admitting: Psychiatry

## 2024-02-07 ENCOUNTER — Other Ambulatory Visit: Payer: Self-pay

## 2024-02-07 DIAGNOSIS — R45851 Suicidal ideations: Secondary | ICD-10-CM | POA: Insufficient documentation

## 2024-02-07 DIAGNOSIS — B86 Scabies: Secondary | ICD-10-CM | POA: Diagnosis not present

## 2024-02-07 DIAGNOSIS — F19959 Other psychoactive substance use, unspecified with psychoactive substance-induced psychotic disorder, unspecified: Secondary | ICD-10-CM | POA: Diagnosis not present

## 2024-02-07 DIAGNOSIS — I456 Pre-excitation syndrome: Secondary | ICD-10-CM | POA: Diagnosis not present

## 2024-02-07 DIAGNOSIS — F1520 Other stimulant dependence, uncomplicated: Secondary | ICD-10-CM | POA: Diagnosis not present

## 2024-02-07 DIAGNOSIS — F1691 Hallucinogen use, unspecified, in remission: Secondary | ICD-10-CM

## 2024-02-07 DIAGNOSIS — R442 Other hallucinations: Secondary | ICD-10-CM | POA: Diagnosis not present

## 2024-02-07 LAB — ANTINUCLEAR ANTIBODIES, IFA: ANA Ab, IFA: NEGATIVE

## 2024-02-07 LAB — PROLACTIN: Prolactin: 8.4 ng/mL (ref 4.8–33.4)

## 2024-02-07 LAB — GC/CHLAMYDIA PROBE AMP (~~LOC~~) NOT AT ARMC
Chlamydia: NEGATIVE
Comment: NEGATIVE
Comment: NORMAL
Neisseria Gonorrhea: NEGATIVE

## 2024-02-07 LAB — SYPHILIS: RPR W/REFLEX TO RPR TITER AND TREPONEMAL ANTIBODIES, TRADITIONAL SCREENING AND DIAGNOSIS ALGORITHM: RPR Ser Ql: NONREACTIVE

## 2024-02-07 MED ORDER — DIPHENHYDRAMINE HCL 50 MG/ML IJ SOLN: 50.0000 mg | Freq: Three times a day (TID) | INTRAMUSCULAR | Status: DC | PRN

## 2024-02-07 MED ORDER — HYDROXYZINE HCL 25 MG PO TABS
25.0000 mg | ORAL_TABLET | Freq: Three times a day (TID) | ORAL | Status: DC | PRN
  Administered 2024-02-08: 25 mg via ORAL
  Filled 2024-02-07 (×2): qty 1

## 2024-02-07 MED ORDER — LORAZEPAM 2 MG/ML IJ SOLN: 2.0000 mg | Freq: Three times a day (TID) | INTRAMUSCULAR | Status: DC | PRN

## 2024-02-07 MED ORDER — OLANZAPINE 2.5 MG PO TABS
1.2500 mg | ORAL_TABLET | Freq: Every day | ORAL | Status: DC
  Administered 2024-02-08 – 2024-02-10 (×3): 1.25 mg via ORAL
  Filled 2024-02-07 (×3): qty 1

## 2024-02-07 MED ORDER — ACETAMINOPHEN 325 MG PO TABS
650.0000 mg | ORAL_TABLET | Freq: Four times a day (QID) | ORAL | Status: DC | PRN
  Administered 2024-02-08 – 2024-02-11 (×8): 650 mg via ORAL
  Filled 2024-02-07 (×9): qty 2

## 2024-02-07 MED ORDER — HALOPERIDOL LACTATE 5 MG/ML IJ SOLN: 5.0000 mg | Freq: Three times a day (TID) | INTRAMUSCULAR | Status: DC | PRN

## 2024-02-07 MED ORDER — HALOPERIDOL LACTATE 5 MG/ML IJ SOLN: 10.0000 mg | Freq: Three times a day (TID) | INTRAMUSCULAR | Status: DC | PRN

## 2024-02-07 MED ORDER — NICOTINE 14 MG/24HR TD PT24
14.0000 mg | MEDICATED_PATCH | Freq: Every day | TRANSDERMAL | Status: DC
  Administered 2024-02-08 – 2024-02-12 (×5): 14 mg via TRANSDERMAL
  Filled 2024-02-07 (×5): qty 1

## 2024-02-07 MED ORDER — ALUM & MAG HYDROXIDE-SIMETH 200-200-20 MG/5ML PO SUSP: 30.0000 mL | ORAL | Status: DC | PRN

## 2024-02-07 MED ORDER — HALOPERIDOL 5 MG PO TABS: 5.0000 mg | ORAL_TABLET | Freq: Three times a day (TID) | ORAL | Status: DC | PRN

## 2024-02-07 MED ORDER — TRAZODONE HCL 50 MG PO TABS
50.0000 mg | ORAL_TABLET | Freq: Every evening | ORAL | Status: DC | PRN
  Administered 2024-02-08 – 2024-02-09 (×2): 50 mg via ORAL
  Filled 2024-02-07 (×2): qty 1

## 2024-02-07 MED ORDER — DIPHENHYDRAMINE HCL 50 MG PO CAPS: 50.0000 mg | ORAL_CAPSULE | Freq: Three times a day (TID) | ORAL | Status: DC | PRN

## 2024-02-07 MED ORDER — MAGNESIUM HYDROXIDE 400 MG/5ML PO SUSP: 30.0000 mL | Freq: Every day | ORAL | Status: DC | PRN

## 2024-02-07 MED ORDER — OLANZAPINE 2.5 MG PO TABS
2.5000 mg | ORAL_TABLET | Freq: Every day | ORAL | Status: DC
  Administered 2024-02-08 – 2024-02-09 (×2): 2.5 mg via ORAL
  Filled 2024-02-07 (×2): qty 1

## 2024-02-07 NOTE — ED Provider Notes (Signed)
 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  She blames an associates girlfriend for getting her involved in prostitution and black mailing her to stay involved.  12/11: Patient was reassessed this morning in the flex area along with med student.  Contact precautions were maintained due to the patient's diagnosis of scabies yesterday. Scabies treatment (permethrin ) was administered at approximately 2000 on 12/10. Patient reports that she is feeling about the same.  Discussed with patient her positive methamphetamine test results.  Patient did not seem particularly surprised.  Recommend patient for substance abuse treatment at the [facility]  Reports: Sleep: Better Appetite: Same Depression: Severe Anxiety: Less severe Auditory Hallucinations: Denies, not seen responding Visual Hallucinations: Denies, not seen responding Paranoia: Continued concern about infestation Delusions: Questionable whether the documented scabies is accompanied by other delusions, patient was certainly indicating she had extra skin on her face yesterday SI: Denies current HI: Denies current   Stay Summary: Patient admitted 12/10 concerns of psychotic disorder due to dissociative drugs, scabies infestation.  Scabies infestation treated antipsychotic therapy initiated.  Patient  deemed appropriate for [facility] for further drug treatment specifically methamphetamines plus or minus MDMA  Total Time spent with patient: 20 minutes  Past Psychiatric Hx: Previous Psych Diagnoses: No previous diagnoses Prior inpatient treatment: Denies Current/prior outpatient treatment: None Prior rehab hx: No history Psychotherapy hx: No history History of suicide: Denies any attempts History of homicide or aggression: Denies any history of aggression Psychiatric medication history: Never been on any mood medicines Psychiatric medication compliance history: Never Neuromodulation history: Never Current Psychiatrist: Never Current therapist: Never   Substance Abuse Hx: Alcohol: Drinks daily, buzzballs 2-3 Last Drink: Last night Number of drinks per day 2-3, but I am not an alcoholic History of alcohol withdrawal seizures: denies History of DT's: denies Tobacco: Uses a vape frequently, cigarettes occasionally Illicit drugs:  Marijuana/THC: Daily marijuana use Cocaine: Denies any history Methamphetamines: Denies any history Benzodiazepines: Denies any history Opioids: Denies any history Hallucinogens: MDMA: Patient is using MDMA on a daily basis multiple times per day in order to stay awake for days at a time.  Her last use was this morning at approximately 2 AM Other: Denies LSD, mushrooms Bath salts: Denies Prescription Drug abuse problems: Denies   Past Medical History: Medical Diagnoses: Patient had eczema as a kid Home Rx: None current Prior Hosp: Only associated with her pregnancies Prior Surgeries/Trauma: Denies any history of head trauma Head trauma, LOC, concussions, seizures:  Allergies: Denies LMP: Few weeks ago Contraception: Takes an injection, PCP: None   Family History: Medical: paternal aunt has breast cancer,paternal grandfather and grandmother have HTN, paternal grandmother has diabetes.  Strong family history of systemic lupus erythematosus  (SLE). Psych: Per patient's aunt: patient's father is reported to have had bipolar disorder, mother reportedly has schizophrenia/bipolar disorder Psych Rx: Unknown SA/HA: Denies Substance use family hx: Unsure   Social History: Childhood (bring, raised, lives  now, parents, siblings, schooling, education): Born in [place] , raised in [place]  has lived here ever since.  Patient was unable to complete high school, went and lived with a boyfriend got pregnant and never finished school Abuse: Patient equivocates on this question Marital Status: Never married Sexual orientation: Prefers men Children: Age 27 lives with aunt, age 41, age 49 both live with their father Employment: Patient is working as a prostitute.  Previously she worked in [employer]: Denies good influences Housing: Has been living in hotels Finances: Glass Blower/designer: Denies Public Librarian: No affiliation      Current Medications:  Current Facility-Administered Medications  Medication Dose Route Frequency Provider Last Rate Last Admin   acetaminophen  (TYLENOL ) tablet 650 mg  650 mg Oral Q6H PRN Brixton Schnapp Benjamin Stallworth, MD       alum & mag hydroxide-simeth (MAALOX/MYLANTA) 200-200-20 MG/5ML suspension 30 mL  30 mL Oral Q4H PRN Delsie Lynwood Morene Lavone, MD       haloperidol (HALDOL) tablet 5 mg  5 mg Oral TID PRN Delsie Lynwood Morene Lavone, MD       And   diphenhydrAMINE  (BENADRYL ) capsule 50 mg  50 mg Oral TID PRN Delsie Lynwood Morene Lavone, MD       haloperidol lactate (HALDOL) injection 5 mg  5 mg Intramuscular TID PRN Delsie Lynwood Morene Lavone, MD       And   diphenhydrAMINE  (BENADRYL ) injection 50 mg  50 mg Intramuscular TID PRN Delsie Lynwood Morene Lavone, MD       And   LORazepam (ATIVAN) injection 2 mg  2 mg Intramuscular TID PRN Delsie Lynwood Morene Lavone, MD       haloperidol lactate (HALDOL) injection 10 mg  10 mg Intramuscular TID PRN  Delsie Lynwood Morene Lavone, MD       And   diphenhydrAMINE  (BENADRYL ) injection 50 mg  50 mg Intramuscular TID PRN Delsie Lynwood Morene Lavone, MD       And   LORazepam (ATIVAN) injection 2 mg  2 mg Intramuscular TID PRN Delsie Lynwood Morene Lavone, MD       hydrOXYzine (ATARAX) tablet 25 mg  25 mg Oral TID PRN Delsie Lynwood Morene Lavone, MD       magnesium hydroxide (MILK OF MAGNESIA) suspension 30 mL  30 mL Oral Daily PRN Delsie Lynwood Morene Lavone, MD       nicotine (NICODERM CQ - dosed in mg/24 hours) patch 14 mg  14 mg Transdermal Q0600 Delsie Lynwood Morene Lavone, MD       OLANZapine  (ZYPREXA ) tablet 1.25 mg  1.25 mg Oral Daily Delsie Lynwood Morene Lavone, MD   1.25 mg at 02/07/24 9153   OLANZapine  (ZYPREXA ) tablet 2.5 mg  2.5 mg Oral QHS Delsie Lynwood Morene Lavone, MD       traZODone (DESYREL) tablet 50 mg  50 mg Oral QHS PRN,MR X 1 Delsie Lynwood Morene Lavone, MD       Current Outpatient Medications  Medication Sig Dispense Refill   hydrOXYzine (ATARAX) 25 MG tablet Take 1 tablet (25 mg total) by mouth every 6 (six) hours. 12 tablet 0   permethrin  (ELIMITE ) 5 % cream Apply to affected area once (Patient taking differently: Apply 1 Application topically once. Apply to affected area once) 60 g 0   triamcinolone  cream (KENALOG ) 0.1 % Apply 1 Application topically 2 (two) times daily. 30 g 0   bacitracin  ointment Apply 1 Application topically 2 (two) times daily. (Patient not  taking: Reported on 02/06/2024) 120 g 0   Chlorhexidine  Gluconate 2 % SOLN Apply 1 Application topically daily for 7 days. (Patient not taking: Reported on 02/06/2024) 473 mL 0    PTA Medications:  Facility Ordered Medications  Medication   acetaminophen  (TYLENOL ) tablet 650 mg   alum & mag hydroxide-simeth (MAALOX/MYLANTA) 200-200-20 MG/5ML suspension 30 mL   magnesium hydroxide (MILK OF MAGNESIA) suspension 30 mL   haloperidol (HALDOL) tablet 5 mg   And   diphenhydrAMINE   (BENADRYL ) capsule 50 mg   haloperidol lactate (HALDOL) injection 5 mg   And   diphenhydrAMINE  (BENADRYL ) injection 50 mg   And   LORazepam (ATIVAN) injection 2 mg   haloperidol lactate (HALDOL) injection 10 mg   And   diphenhydrAMINE  (BENADRYL ) injection 50 mg   And   LORazepam (ATIVAN) injection 2 mg   hydrOXYzine (ATARAX) tablet 25 mg   traZODone (DESYREL) tablet 50 mg   nicotine (NICODERM CQ - dosed in mg/24 hours) patch 14 mg   OLANZapine  (ZYPREXA ) tablet 1.25 mg   OLANZapine  (ZYPREXA ) tablet 2.5 mg   [COMPLETED] permethrin  (ELIMITE ) 5 % cream 1 Application   PTA Medications  Medication Sig   bacitracin  ointment Apply 1 Application topically 2 (two) times daily. (Patient not taking: Reported on 02/06/2024)   Chlorhexidine  Gluconate 2 % SOLN Apply 1 Application topically daily for 7 days. (Patient not taking: Reported on 02/06/2024)       10/20/2021    1:30 PM 10/13/2021   11:22 AM 06/30/2021    4:14 PM  Depression screen PHQ 2/9  Decreased Interest 0 2 0  Down, Depressed, Hopeless 0 0 0  PHQ - 2 Score 0 2 0  Altered sleeping 0 0 0  Tired, decreased energy 3 2 2   Change in appetite 0 0 0  Feeling bad or failure about yourself  0 0 0  Trouble concentrating 0 0 0  Moving slowly or fidgety/restless 0 0 0  Suicidal thoughts 0 0 0  PHQ-9 Score 3  4  2       Data saved with a previous flowsheet row definition    Flowsheet Row ED from 02/06/2024 in [facility] Most recent reading at 02/06/2024  2:10 PM ED from 02/06/2024 in [facility] Emergency Department at [facility] Most recent reading at 02/06/2024  6:55 AM ED from 02/04/2024 in [facility] Emergency Department at [facility] Most recent reading at 02/04/2024  5:40 AM  C-SSRS RISK CATEGORY Low Risk No Risk No Risk    Musculoskeletal  Strength & Muscle Tone: within normal limits Gait & Station: normal Patient leans: N/A  Psychiatric Specialty Exam  Mental Status  Exam:  Appearance: Sleepy African-American female of thinner than average build.  Behavior: Patient reluctant to pull her head out of the covers, minimally interactive  Attitude: Grudgingly cooperative  Speech: Sparse  Mood: Tired and depressed  Affect: Congruent  Thought Process: Linear coherent  Thought Content: Unable to assess meaningfully  SI/HI: Denies all  Perceptions: Denies, not seen responding, not asking providers whether they see moving items  Judgment: Shallow  Insight: Shallow  Fund of Knowledge: WNL     Physical Exam  Physical Exam Vitals and nursing note reviewed. Exam conducted with a chaperone present.  Neurological:     Mental Status: She is oriented to person, place, and time.    Review of Systems  Constitutional:  Positive for malaise/fatigue. Negative for chills, fever and weight loss.  Gastrointestinal:  Negative for abdominal pain, blood in stool, constipation, diarrhea and vomiting.  Psychiatric/Behavioral:  Positive for depression and substance abuse. Negative for hallucinations and suicidal ideas. The patient is nervous/anxious. The patient does not have insomnia.    Blood pressure 133/79, pulse 92, temperature 98.2 F (36.8 C), temperature source Oral, resp. rate 18, last menstrual period 01/28/2024, SpO2 98%. There is no height or weight on file to calculate BMI.  Suicide Risk Assessment:  Suicidal ideation/thoughts:   []  Current         [x]  Recent         []  Denies        []  Remote   []  Chronic  Intention to act or plan:        []  Current     []  Recent [x]  Denies  []  Remote  Preparatory behavior:  []  Recent    [x]  Denies Recent      []  Remote Instance  Suicide attempts:  []  Immediately prior to this admission     []  During this admission    []  Recent    []  Multiple   []  Remote    [x]  Denies Ever     Risk Factors  Protective Factors  Acute  Escalating substance use, Recent loss (job, relationship, family member), Acute distress state,  Hopelessness or desperation, Sense of purposelessness, Sense of being a burden to others, Withdrawing from society, Acute insomnia, Recent impulsivity or acting recklessly, Feelings of humiliation, shame, or guilt, Feelings of rejection or abandonment, Worsening medical condition, Severe pain, and Currently psychotic AcuteSuicideProtectiveFactors: No access to highly lethal means, Denies current SI or Intent, No command AH encouraging suicide, No recent exposure to suicide, and No recent psychiatric hospitalizations  Chronic Emotional lability, Poor coping or problem solving skills, History of abuse/trauma, Pattern of impulsivity/recklessness, Single, Separated, or Divorced, Chronic unemployment, Chronic homelessness, Lack of social support, Lives alone, and Barriers to accessing healthcare No history of violence, Age (25-59), and Willingness to seek help   Potential future factors that may impact risk: FutureSuicideFactors : Anniversary of important loss approaching, Criminal proceedings, and Expected change in living situation  Summary: While it is impossible to accurately predict with absolute certainty future events and human behaviors, an assessment of current suicidal indicators, risk factors, and protective factors suggests that this patient's:   Acute suicide risk is: moderate in degree.    Chronic suicide risk is: mild in degree.   Suicide risk increases with substance/alcohol use and acute intoxication.   Plan Of Care/Follow-up recommendations:  Patient is a 28 year old female with minimal psychiatric history.  Who presents after an ED visit where she was obsessed with the idea that she has parasites infesting her skin.  This is her 2nd or 3rd ED visit for similar presentation leading to her coming in for a mental health evaluation.  Patient has never seen a psychiatrist or a therapist of any kind. 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  Differential is wide.  Top of differential is  substance-induced psychosis due to the patient's frequent and escalating MDMA and marijuana use.  Next are possible medical explanations for a psychotic break including lupus, other autoimmune causes, infection (patient has high risk sexual practices).  Next on the list would be primary psychiatric disorders given her family history of bipolar disorder and questionable schizophrenia.   Medical labs ordered all of come back within normal limits or negative aside from UDS.  She was positive for methamphetamines which she denied any history of  using.  The presence of methamphetamines would explain the worsening paranoia, obsession with skin infestation.  Patient is agreeable to treatment in the facility based crisis center Disposition: To [facility] at [address] Lavone Bash, MD 02/07/2024, 3:35 PM

## 2024-02-07 NOTE — ED Notes (Signed)
 Pt repositioning in bed and sleeping long intervals through night.  Permetherin cream application applied to affected areas of skin.  Safety maintained.

## 2024-02-07 NOTE — ED Notes (Signed)
 Pt taking shower prior to [facility] transfer.

## 2024-02-07 NOTE — Progress Notes (Signed)
 Pt is asleep. Respirations are even and unlabored. No signs of acute distress noted. Staff will monitor for pt's safety.

## 2024-02-07 NOTE — ED Provider Notes (Signed)
 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  She blames an associates girlfriend for getting her involved in prostitution and black mailing her to stay involved.   Patient reports worsening dependence on MDMA using multiple doses per day in order to keep herself awake and avoid any symptoms associated with her emotional state.  Patient also notes that some time in this period, she began to have symptoms that she blames on some kind of parasite or excess skin problem.  She points to a malar rash on her face and excoriations on her hands.  She reports that I am not crazy, I see things moving when I put my washrag's down she reports that the symptoms associated with obsession over her skin has led to her spending hours per day picking at her skin across the bridge of her nose, and around her hands and fingers.  She reports that there are similar lesions and areas of excoriation in many of her intertriginous areas.  PHQ 2-9:  Flowsheet Row Routine Prenatal from 10/20/2021 in [facility] Routine Prenatal from 10/13/2021 in [facility] [employer] [facility] for Women Routine Prenatal from 06/30/2021 in [facility] at [facility]  Thoughts that you would be better off dead, or of hurting yourself in some way Not at all Not at all Not at all  PHQ-9 Total Score 3 4 2     Flowsheet Row ED from 02/06/2024 in [facility] Most recent reading at 02/06/2024  2:10 PM ED from 02/06/2024 in [facility] Emergency Department at [facility] Most recent reading at 02/06/2024  6:55 AM ED from 02/04/2024 in [facility] Emergency Department at [facility] Most recent reading at 02/04/2024  5:40 AM  C-SSRS RISK CATEGORY Low Risk No Risk No Risk      Total Time spent with patient: 30 minutes  Musculoskeletal  Strength & Muscle Tone: within normal limits Gait & Station: normal Patient leans: N/A  Psychiatric Specialty Exam  Presentation  Appearance: Sleepy African-American female of thinner than average build.  Behavior: Patient reluctant to pull her head out of the covers, minimally interactive  Attitude: Grudgingly cooperative  Speech: Sparse  Mood: Tired and depressed  Affect: Congruent  Thought Process: Linear coherent  Thought Content: Unable to assess meaningfully  SI/HI: Denies all  Perceptions: Denies, not seen responding, not asking providers whether they see moving items  Judgment: Shallow  Insight: Shallow  Fund of Knowledge: WNL    No data recorded  Physical Exam Vitals and nursing note reviewed.  Constitutional:      General: She is not in acute distress.    Appearance: She is not ill-appearing or toxic-appearing.  HENT:     Head: Normocephalic and atraumatic.  Pulmonary:     Effort: Pulmonary effort is normal.  Skin:  General: Skin is warm and dry.  Neurological:     Mental Status: She is oriented to person, place, and time.  Psychiatric:        Attention and Perception: She is inattentive. She does not perceive auditory or visual hallucinations.        Mood and Affect: Mood is depressed.        Speech: She is  noncommunicative.        Behavior: Behavior is uncooperative, slowed and withdrawn.        Thought Content: Thought content normal.        Cognition and Memory: Memory normal.        Judgment: Judgment is impulsive.    Review of Systems  Skin:  Positive for itching and rash.  Psychiatric/Behavioral:  Positive for depression and substance abuse. Negative for hallucinations and suicidal ideas. The patient is nervous/anxious and has insomnia.     Blood pressure 133/79, pulse 92, temperature 98.2 F (36.8 C), temperature source Oral, resp. rate 18, last menstrual period 01/28/2024, SpO2 98%. There is no height or weight on file to calculate BMI.  Past Psychiatric Hx: Previous Psych Diagnoses: No previous diagnoses Prior inpatient treatment: Denies Current/prior outpatient treatment: None Prior rehab hx: No history Psychotherapy hx: No history History of suicide: Denies any attempts History of homicide or aggression: Denies any history of aggression Psychiatric medication history: Never been on any mood medicines Psychiatric medication compliance history: Never Neuromodulation history: Never Current Psychiatrist: Never Current therapist: Never   Substance Abuse Hx: Alcohol: Drinks daily, buzzballs 2-3 Last Drink: Last night Number of drinks per day 2-3, but I am not an alcoholic History of alcohol withdrawal seizures: denies History of DT's: denies Tobacco: Uses a vape frequently, cigarettes occasionally Illicit drugs:  Marijuana/THC: Daily marijuana use Cocaine: Denies any history Methamphetamines: Denies any history Benzodiazepines: Denies any history Opioids: Denies any history Hallucinogens: MDMA: Patient is using MDMA on a daily basis multiple times per day in order to stay awake for days at a time.  Her last use was this morning at approximately 2 AM Other: Denies LSD, mushrooms Bath salts: Denies Prescription Drug abuse problems: Denies   Past Medical  History: Medical Diagnoses: Patient had eczema as a kid Home Rx: None current Prior Hosp: Only associated with her pregnancies Prior Surgeries/Trauma: Denies any history of head trauma Head trauma, LOC, concussions, seizures:  Allergies: Denies LMP: Few weeks ago Contraception: Takes an injection, PCP: None   Family History: Medical: paternal aunt has breast cancer,paternal grandfather and grandmother have HTN, paternal grandmother has diabetes.  Strong family history of systemic lupus erythematosus (SLE). Psych: Per patient's aunt: patient's father is reported to have had bipolar disorder, mother reportedly has schizophrenia/bipolar disorder Psych Rx: Unknown SA/HA: Denies Substance use family hx: Unsure   Social History: Childhood (bring, raised, lives now, parents, siblings, schooling, education): Born in [place] , raised in [place]  has lived here ever since.  Patient was unable to complete high school, went and lived with a boyfriend got pregnant and never finished school Abuse: Patient equivocates on this question Marital Status: Never married Sexual orientation: Prefers men Children: Age 80 lives with aunt, age 30, age 33 both live with their father Employment: Patient is working as a prostitute.  Previously she worked in [employer]: Denies good influences Housing: Has been living in hotels Finances: Glass Blower/designer: Denies Public Librarian: No affiliation  Is the patient at risk to self? Yes  Has the patient  been a risk to self in the past 6 months? No .    Has the patient been a risk to self within the distant past? No   Is the patient a risk to others? No   Has the patient been a risk to others in the past 6 months? No   Has the patient been a risk to others within the distant past? No   Last Labs:  Admission on 02/06/2024  Component Date Value Ref Range Status   Alcohol, Ethyl (B) 02/06/2024 <15  <15 mg/dL Final   Comment:  (NOTE) For medical purposes only. Performed at [facility] Lab, [address] [place], [place] [address]    TSH 02/06/2024 1.166  0.350 - 4.500 uIU/mL Final   Comment: Performed by a 3rd Generation assay with a functional sensitivity of <=0.01 uIU/mL. Performed at [facility], [address], [place], [place] [address]    Prolactin 02/06/2024 8.4  4.8 - 33.4 ng/mL Final   Comment: (NOTE) Performed At: [facility] [address] [place], [place] [id number] Jennette Shorter MD Ey:[id number]    Neisseria Gonorrhea 02/06/2024 Negative   Final   Chlamydia 02/06/2024 Negative   Final   Comment 02/06/2024 Normal Reference Ranger Chlamydia - Negative   Final   Comment 02/06/2024 Normal Reference Range Neisseria Gonorrhea - Negative   Final   RPR Ser Ql 02/06/2024 NON REACTIVE  NON REACTIVE Final   Performed at [facility], [address] [place], [place] [address]   ANA Ab, IFA 02/06/2024 Negative   Final   Comment: (NOTE)                                     Negative   <1:80                                     Borderline  1:80                                     Positive   >1:80 ICAP nomenclature: AC-0 For more information about Hep-2 cell patterns use ANApatterns.org, the official website for the International Consensus on Antinuclear Antibody (ANA) Patterns (ICAP). Performed At: [facility] [address] [place], [place] [id number] Jennette Shorter MD Ey:[id number]    Sed Rate 02/06/2024 4  0 - 22 mm/hr Final   Performed at [facility], [address], [place], [place] [address]   CRP 02/06/2024 <0.5  <1.0 mg/dL Final   Performed at [facility], [address] [place], [place] [address]   POC Amphetamine UR 02/06/2024 Positive (A)  NONE DETECTED (Cut Off Level 1000 ng/mL) Final   POC Secobarbital (BAR) 02/06/2024 None Detected  NONE DETECTED (Cut Off Level 300 ng/mL) Final   POC Buprenorphine (BUP) 02/06/2024 None Detected  NONE  DETECTED (Cut Off Level 10 ng/mL) Final   POC Oxazepam (BZO) 02/06/2024 None Detected  NONE DETECTED (Cut Off Level 300 ng/mL) Final   POC Cocaine UR 02/06/2024 None Detected  NONE DETECTED (Cut Off Level 300 ng/mL) Final   POC Methamphetamine UR 02/06/2024 Positive (A)  NONE DETECTED (Cut Off Level 1000 ng/mL) Final   POC Morphine 02/06/2024 None Detected  NONE DETECTED (Cut  Off Level 300 ng/mL) Final   POC Methadone UR 02/06/2024 None Detected  NONE DETECTED (Cut Off Level 300 ng/mL) Final   POC Oxycodone  UR 02/06/2024 None Detected  NONE DETECTED (Cut Off Level 100 ng/mL) Final   POC Marijuana UR 02/06/2024 Positive (A)  NONE DETECTED (Cut Off Level 50 ng/mL) Final  Admission on 02/06/2024, Discharged on 02/06/2024  Component Date Value Ref Range Status   WBC 02/06/2024 6.7  4.0 - 10.5 K/uL Final   RBC 02/06/2024 4.34  3.87 - 5.11 MIL/uL Final   Hemoglobin 02/06/2024 12.8  12.0 - 15.0 g/dL Final   HCT 87/89/7974 38.7  36.0 - 46.0 % Final   MCV 02/06/2024 89.2  80.0 - 100.0 fL Final   MCH 02/06/2024 29.5  26.0 - 34.0 pg Final   MCHC 02/06/2024 33.1  30.0 - 36.0 g/dL Final   RDW 87/89/7974 14.8  11.5 - 15.5 % Final   Platelets 02/06/2024 435 (H)  150 - 400 K/uL Final   nRBC 02/06/2024 0.0  0.0 - 0.2 % Final   Performed at [facility] Lab, [address] [place], [place] [address]   Sodium 02/06/2024 138  135 - 145 mmol/L Final   Potassium 02/06/2024 3.9  3.5 - 5.1 mmol/L Final   Chloride 02/06/2024 105  98 - 111 mmol/L Final   CO2 02/06/2024 24  22 - 32 mmol/L Final   Glucose, Bld 02/06/2024 92  70 - 99 mg/dL Final   Glucose reference range applies only to samples taken after fasting for at least 8 hours.   BUN 02/06/2024 12  6 - 20 mg/dL Final   Creatinine, Ser 02/06/2024 0.73  0.44 - 1.00 mg/dL Final   Calcium 87/89/7974 9.3  8.9 - 10.3 mg/dL Final   GFR, Estimated 02/06/2024 >60  >60 mL/min Final   Comment: (NOTE) Calculated using the CKD-EPI Creatinine Equation (2021)     Anion gap 02/06/2024 9  5 - 15 Final   Performed at [facility], [address], [place], [place] [address]   Total Protein 02/06/2024 7.1  6.5 - 8.1 g/dL Final   Albumin 87/89/7974 3.8  3.5 - 5.0 g/dL Final   AST 87/89/7974 22  15 - 41 U/L Final   ALT 02/06/2024 19  0 - 44 U/L Final   Alkaline Phosphatase 02/06/2024 42  38 - 126 U/L Final   Total Bilirubin 02/06/2024 0.4  0.0 - 1.2 mg/dL Final   Bilirubin, Direct 02/06/2024 <0.1  0.0 - 0.2 mg/dL Final   Indirect Bilirubin 02/06/2024 NOT CALCULATED  0.3 - 0.9 mg/dL Final   Performed at [facility] Lab, [address] [place], [place] [address]    Allergies: Patient has no known allergies.  Medications:  Facility Ordered Medications  Medication   acetaminophen  (TYLENOL ) tablet 650 mg   alum & mag hydroxide-simeth (MAALOX/MYLANTA) 200-200-20 MG/5ML suspension 30 mL   magnesium hydroxide (MILK OF MAGNESIA) suspension 30 mL   haloperidol (HALDOL) tablet 5 mg   And   diphenhydrAMINE  (BENADRYL ) capsule 50 mg   haloperidol lactate (HALDOL) injection 5 mg   And   diphenhydrAMINE  (BENADRYL ) injection 50 mg   And   LORazepam (ATIVAN) injection 2 mg   haloperidol lactate (HALDOL) injection 10 mg   And   diphenhydrAMINE  (BENADRYL ) injection 50 mg   And   LORazepam (ATIVAN) injection 2 mg   hydrOXYzine (ATARAX) tablet 25 mg   traZODone (DESYREL) tablet 50 mg   nicotine (NICODERM CQ -  dosed in mg/24 hours) patch 14 mg   OLANZapine  (ZYPREXA ) tablet 1.25 mg   OLANZapine  (ZYPREXA ) tablet 2.5 mg   [COMPLETED] permethrin  (ELIMITE ) 5 % cream 1 Application   PTA Medications  Medication Sig   bacitracin  ointment Apply 1 Application topically 2 (two) times daily. (Patient not taking: Reported on 02/06/2024)   Chlorhexidine  Gluconate 2 % SOLN Apply 1 Application topically daily for 7 days. (Patient not taking: Reported on 02/06/2024)    Long Term Goals: Improvement in symptoms so as ready for discharge  Short Term Goals: Patient  will verbalize feelings in meetings with treatment team members., Patient will attend at least of 50% of the groups daily., Pt will complete the PHQ9 on admission, day 3 and discharge., Patient will participate in completing the [place] Suicide Severity Rating Scale, Patient will score a low risk of violence for 24 hours prior to discharge, and Patient will take medications as prescribed daily.  Medical Decision Making  Patient is a 28 year old female with minimal psychiatric history.  Who presents after an ED visit where she was obsessed with the idea that she has parasites infesting her skin.  This is her 2nd or 3rd ED visit for similar presentation leading to her coming in for a mental health evaluation.  Patient has never seen a psychiatrist or a therapist of any kind. 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  Differential is wide.  Top of differential is substance-induced psychosis due to the patient's frequent and escalating MDMA and marijuana use.  Next are possible medical explanations for a psychotic break including lupus, other autoimmune causes, infection (patient has high risk sexual practices).  Next on the list would be primary psychiatric disorders given her family history of bipolar disorder and questionable schizophrenia.   Medical labs ordered all of come back within normal limits or negative aside from UDS.  She was positive for methamphetamines which she denied any history of using.  The presence of methamphetamines would explain the worsening paranoia, obsession with skin infestation.   Patient is agreeable to treatment in the facility based crisis center    Start olanzapine  1.25 mg during the day, 2.5 mg at night for hallucinations, psychotic symptoms Start hydroxyzine 25 mg 3 times daily as needed for anxiety Start permethrin  that was not started in emergency department for possible scabies  Recommendations  Based on my evaluation the patient does not appear to have an  emergency medical condition.  Lynwood Morene Lavone Delsie, MD 02/07/2024  4:30 PM

## 2024-02-07 NOTE — Progress Notes (Signed)
Pt is awake, alert and oriented X4. Pt did not voice any complaints of pain or discomfort. No signs of acute distress noted. Administered scheduled meds per order. Pt denies current SI/HI/AVH, plan or intent. Staff will monitor for pt's safety.

## 2024-02-07 NOTE — ED Notes (Signed)
 Pt continues to sleep. No distress noted or concerns voiced. Staff will monitor for pt's safety.

## 2024-02-07 NOTE — ED Notes (Signed)
 Scabies cream placed at 2021, must remain on for 14 hours as per nursing communication order.

## 2024-02-07 NOTE — ED Notes (Signed)
 Pt admitted to [facility] this shift. VS stable, no reports of pain. She denies SI/HI/AVH. Snack and fluids provided. Skin check performed, see flowsheets. Pt oriented to unit. No meds given this shift. She was cooperative, fidgety, and pleasant. Pt resting in bed, no acute distress noted. Respirations even and unlabored. Continue to monitor for safety.

## 2024-02-08 DIAGNOSIS — F19959 Other psychoactive substance use, unspecified with psychoactive substance-induced psychotic disorder, unspecified: Secondary | ICD-10-CM | POA: Diagnosis not present

## 2024-02-08 DIAGNOSIS — F1520 Other stimulant dependence, uncomplicated: Secondary | ICD-10-CM | POA: Diagnosis not present

## 2024-02-08 DIAGNOSIS — R442 Other hallucinations: Secondary | ICD-10-CM | POA: Diagnosis not present

## 2024-02-08 DIAGNOSIS — R45851 Suicidal ideations: Secondary | ICD-10-CM | POA: Diagnosis not present

## 2024-02-08 LAB — POC URINE PREG, ED: Preg Test, Ur: NEGATIVE

## 2024-02-08 NOTE — ED Notes (Signed)
 Pt did attend [facility].

## 2024-02-08 NOTE — [facility] Group Notes (Signed)
 Spiritual Care and Counseling Group Note  02/08/2024 2:45pm  Facilitated by: Librada Donnice Lin   Type of Therapy and Topic:  Hope    Participation Level:  Did Not Attend  Description of Group:  Group focused on topic of hope.  Patients participated in facilitated discussion around topic, connecting with one another around experiences and definitions for hope.  Group members engaged with group word cloud.  Members selected an image of what hope looks like for them today.  Group engaged in discussion around how their definitions of hope are present today in hospital.    Summary of Patient Progress:  DID NOT ATTEND    Therapeutic Modalities: Psycho-social ed, Adlerian, Narrative, MI   Librada Donnice Lin, Chaplain 02/08/2024 4:10 PM

## 2024-02-08 NOTE — Group Note (Signed)
 Group Topic: Recovery Basics  Group Date: 02/08/2024 Start Time: 2015 End Time: 2115 Facilitators: Nathanael Moulder, NT  Department: [facility]  Number of Participants: 6  Group Focus: community group, coping skills, relapse prevention, and social skills Treatment Modality:  Psychoeducation and Skills Training Interventions utilized were support Purpose: express feelings, relapse prevention strategies  Name: Madison Soto Date of Birth: November 17, 1995  MR: [id number]    Level of Participation: didn't attend Quality of Participation: didn't attend Interactions with others: didn't attend Mood/Affect: didn't attend Triggers (if applicable): didn't attend Cognition: didn't attend Progress: None Response: didn't attend Plan: patient will be encouraged to attend future groups  Patients Problems:  Patient Active Problem List   Diagnosis Date Noted   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

## 2024-02-08 NOTE — Group Note (Signed)
 Group Topic: Relaxation  Group Date: 02/08/2024 Start Time: 1200 End Time: 1230 Facilitators: Reinhold Minor BROCKS, RN  Department: [facility]  Number of Participants: 6  Group Focus: relaxation Treatment Modality:  Psychoeducation Interventions utilized were patient education Purpose: reinforce self-care  Name: Madison Soto Date of Birth: July 30, 1995  MR: [id number]    Level of Participation: minimal Quality of Participation: cooperative Interactions with others: gave feedback Mood/Affect: appropriate Triggers (if applicable): NA Cognition: goal directed Progress: Moderate Response: NA Plan: patient will be encouraged to practice relaxation techniques regularly  Patients Problems:  Patient Active Problem List   Diagnosis Date Noted   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

## 2024-02-08 NOTE — ED Notes (Signed)
 Patient remains on unit with no acute changes noted at this time. Alert and oriented x3. Interacting with peers/staff minimally. No current suicidal or homicidal ideation reported. No hallucinations or delusions observed. Patient is tolerating the milieu.pt in room most of the time, came out and had breakfast. Vitals within normal limits.

## 2024-02-08 NOTE — Group Note (Signed)
 Group Topic: Decisional Balance/Substance Abuse  Group Date: 02/08/2024 Start Time: 1100 End Time: 1200 Facilitators: Ricky Gallery, LCSW  Department: [facility]  Number of Participants: 2  Group Focus: acceptance, anger management, art therapy, chemical dependency issues, co-dependency, communication, depression, dual diagnosis, goals/reality orientation, problem solving, relapse prevention, and substance abuse education Treatment Modality:  Art Therapy Interventions utilized were group exercise and patient education Purpose: enhance coping skills, express feelings, express irrational fears, improve communication skills, relapse prevention strategies, and trigger / craving management  Writer explored with the group an Art exercise called  Masking My Identity.   Clients will create masks to show how they see themselves versus how they think others see them. This activity is excellent for self-reflection, identity exploration, and emotional expression.  This activity utilized MI and CBT on how they want to increase self worth and how substance abuse causes them to mask their true identities.  The activity also explored triggers and self-worth tools to regain a sense of love and purpose.   Name: [name] Date of Birth: 1996-01-02  MR: [id number]    Level of Participation: Client did not attend group.  She reports she wanted to take a shower instead.  Client did sit in group for the last 15 mins and gave feedback. Quality of Participation: cooperative Interactions with others: gave feedback Mood/Affect: brightens with interaction Triggers (if applicable):  wasn't able to explore.  Not in group long enough Cognition: goal directed Progress: Moderate Response: Client was much more open during the end of the group than when the Writer initially met with her this morning.   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

## 2024-02-08 NOTE — ED Provider Notes (Signed)
 Facility Based Crisis [facility]) Behavioral Health Progress Note  Date and Time: 02/08/2024 9:16 AM Name: Madison Soto MRN:  [id number]  Subjective:  My skin is irritated   Diagnosis:  Final diagnoses:  Substance-induced psychotic disorder (HCC)  Tactile hallucination  WPW (Wolff-Parkinson-White syndrome)  Methamphetamine dependence (HCC)  History of methylenedioxymethamphetamine (MDMA) use   Chart reviewed with attending psychiatrist, Dr Garvin Gaines.  Per triage on 02/06/2024, Madison Soto is a 28 year old female presenting to [facility] unaccompanied. Pt states she is having suicidal thoughts today, but no plan. Pt denies any past suicide attempts. Per chat review, pt has been treated for Scabies today at the ED. Pt is tearful throughout triage due to itching constantly. Pt also states she has been using ecstasy off and on. Pt reports that she is looking for medication at this time of triage and any mental health treatment. Pt denies alcohol use, Si, HI and AVH at this time.   02/08/2024 Pt is seen face-to-face on the Facility Based Crisis [facility]) unit. Pt is alert, irritable and minimally engages in today's assessment. She answers most questions as I'm tired of repeating myself. I've answered these questions like 10 times, or you can look in my chart. She is laying in bed facing the wall with her back to this practitioner. States that she came to the hospital because my skin is irritated. She was treated with permethrin  cream on 02/06/2024 which remained topically until patient washed it off on 02/07/2024. She endorses the use of THC and is guarded regarding use of other substances. UDS on 02/06/24 POS for amphetamine, methamphetamine and THC. Per treatment team meeting, patient is under review at [facility] for residential SUD treatment post-discharge.   Total Time spent with patient: 10 mins  Past Psychiatric History: Previous Psych Diagnoses: No previous diagnoses Prior inpatient  treatment: Denies Current/prior outpatient treatment: None Prior rehab hx: No history Psychotherapy hx: No history History of suicide: Denies any attempts History of homicide or aggression: Denies any history of aggression Psychiatric medication history: Never been on any mood medicines Psychiatric medication compliance history: Never Neuromodulation history: Never Current Psychiatrist: Never Current therapist: Never Past Medical History: Eczema, Asthma in childhood Family History: Medical: paternal aunt has breast cancer,paternal grandfather and grandmother have HTN, paternal grandmother has diabetes.  Strong family history of systemic lupus erythematosus (SLE). Psych: Per patient's aunt: patient's father is reported to have had bipolar disorder, mother reportedly has schizophrenia/bipolar disorder Psych Rx: Unknown SA/HA: Denies Substance use family hx: Unsure  Social History: Childhood (bring, raised, lives now, parents, siblings, schooling, education): Born in [place] , raised in [place]  has lived here ever since.  Patient was unable to complete high school, went and lived with a boyfriend got pregnant and never finished school Abuse: Patient equivocates on this question Marital Status: Never married Sexual orientation: Prefers men Children: Age 52 lives with aunt, age 615, age 61 both live with their father Employment: Patient is working as a prostitute.  Previously she worked in [employer]: Denies good influences Housing: Has been living in hotels Finances: Glass Blower/designer: Denies Public Librarian: No affiliation  Additional Social History:     Sleep: Fair; requesting to be left alone so that she can sleep  Appetite:  Good  Current Medications:  Current Facility-Administered Medications  Medication Dose Route Frequency Provider Last Rate Last Admin   acetaminophen  (TYLENOL ) tablet 650 mg  650 mg Oral Q6H PRN Delsie Lynwood Morene Lavone, MD  650 mg at 02/08/24 0232   alum & mag hydroxide-simeth (MAALOX/MYLANTA) 200-200-20 MG/5ML suspension 30 mL  30 mL Oral Q4H PRN Delsie Lynwood Morene Lavone, MD       haloperidol (HALDOL) tablet 5 mg  5 mg Oral TID PRN Delsie Lynwood Morene Lavone, MD       And   diphenhydrAMINE  (BENADRYL ) capsule 50 mg  50 mg Oral TID PRN Delsie Lynwood Morene Lavone, MD       haloperidol lactate (HALDOL) injection 5 mg  5 mg Intramuscular TID PRN Delsie Lynwood Morene Lavone, MD       And   diphenhydrAMINE  (BENADRYL ) injection 50 mg  50 mg Intramuscular TID PRN Delsie Lynwood Morene Lavone, MD       And   LORazepam (ATIVAN) injection 2 mg  2 mg Intramuscular TID PRN Delsie Lynwood Morene Lavone, MD       haloperidol lactate (HALDOL) injection 10 mg  10 mg Intramuscular TID PRN Delsie Lynwood Morene Lavone, MD       And   diphenhydrAMINE  (BENADRYL ) injection 50 mg  50 mg Intramuscular TID PRN Delsie Lynwood Morene Lavone, MD       And   LORazepam (ATIVAN) injection 2 mg  2 mg Intramuscular TID PRN Delsie Lynwood Morene Lavone, MD       hydrOXYzine (ATARAX) tablet 25 mg  25 mg Oral TID PRN Delsie Lynwood Morene Lavone, MD       magnesium hydroxide (MILK OF MAGNESIA) suspension 30 mL  30 mL Oral Daily PRN Delsie Lynwood Morene Lavone, MD       nicotine (NICODERM CQ - dosed in mg/24 hours) patch 14 mg  14 mg Transdermal Q0600 Delsie Lynwood Morene Lavone, MD   14 mg at 02/08/24 9050   OLANZapine  (ZYPREXA ) tablet 1.25 mg  1.25 mg Oral Daily Delsie Lynwood Morene Lavone, MD   1.25 mg at 02/08/24 9050   OLANZapine  (ZYPREXA ) tablet 2.5 mg  2.5 mg Oral QHS Delsie Lynwood Morene Lavone, MD       traZODone (DESYREL) tablet 50 mg  50 mg Oral QHS PRN,MR X 1 Wise, James Benjamin Stallworth, MD       Current Outpatient Medications  Medication Sig Dispense Refill   bacitracin  ointment Apply 1 Application topically 2 (two) times daily. (Patient not taking: Reported on  02/06/2024) 120 g 0   Chlorhexidine  Gluconate 2 % SOLN Apply 1 Application topically daily for 7 days. (Patient not taking: Reported on 02/06/2024) 473 mL 0   hydrOXYzine (ATARAX) 25 MG tablet Take 1 tablet (25 mg total) by mouth every 6 (six) hours. 12 tablet 0   permethrin  (ELIMITE ) 5 % cream Apply to affected area once (Patient taking differently: Apply 1 Application topically once. Apply to affected area once) 60 g 0   triamcinolone  cream (KENALOG ) 0.1 % Apply 1 Application topically 2 (two) times daily. 30 g 0    Labs  Lab Results:  Admission on 02/07/2024  Component Date Value Ref Range Status   Preg Test, Ur 02/08/2024 Negative  Negative Final  Admission on 02/06/2024, Discharged on 02/07/2024  Component Date Value Ref Range Status   Alcohol, Ethyl (B) 02/06/2024 <15  <15 mg/dL Final   Comment: (NOTE) For medical purposes only. Performed at [facility], [address], [place], [place] [address]    TSH 02/06/2024 1.166  0.350 - 4.500 uIU/mL Final   Comment: Performed by a 3rd Generation assay with a functional sensitivity of <=0.01 uIU/mL. Performed at [facility], [address] [place], [place] [address]    Prolactin 02/06/2024 8.4  4.8 - 33.4 ng/mL Final   Comment: (NOTE) Performed At: [facility] [address] [place], [place] [id number] Jennette Shorter MD Ey:[id number]    Neisseria Gonorrhea 02/06/2024 Negative   Final   Chlamydia 02/06/2024 Negative   Final   Comment 02/06/2024 Normal Reference Ranger Chlamydia - Negative   Final   Comment 02/06/2024 Normal Reference Range Neisseria Gonorrhea - Negative   Final   RPR Ser Ql 02/06/2024 NON REACTIVE  NON REACTIVE Final   Performed at [facility], [address] [place], [place] [address]   ANA Ab, IFA 02/06/2024 Negative   Final   Comment: (NOTE)                                     Negative   <1:80                                     Borderline  1:80                                      Positive   >1:80 ICAP nomenclature: AC-0 For more information about Hep-2 cell patterns use ANApatterns.org, the official website for the International Consensus on Antinuclear Antibody (ANA) Patterns (ICAP). Performed At: [facility] [address] [place], [place] [id number] Jennette Shorter MD Ey:[id number]    Sed Rate 02/06/2024 4  0 - 22 mm/hr Final   Performed at [facility], [address], [place], [place] [address]   CRP 02/06/2024 <0.5  <1.0 mg/dL Final   Performed at [facility], [address] [place], [place] [address]   POC Amphetamine UR 02/06/2024 Positive (A)  NONE DETECTED (Cut Off Level 1000 ng/mL) Final   POC Secobarbital (BAR) 02/06/2024 None Detected  NONE DETECTED (Cut Off Level 300 ng/mL) Final   POC Buprenorphine (BUP) 02/06/2024 None Detected  NONE DETECTED (Cut Off Level 10 ng/mL) Final   POC Oxazepam (BZO) 02/06/2024 None Detected  NONE DETECTED (Cut Off Level 300 ng/mL) Final   POC Cocaine UR 02/06/2024 None Detected  NONE DETECTED (Cut Off Level 300 ng/mL) Final   POC Methamphetamine UR 02/06/2024 Positive (A)  NONE DETECTED (Cut Off Level 1000 ng/mL) Final   POC Morphine 02/06/2024 None Detected  NONE DETECTED (Cut Off Level 300 ng/mL) Final   POC Methadone UR 02/06/2024 None Detected  NONE DETECTED (Cut Off Level 300 ng/mL) Final   POC Oxycodone  UR 02/06/2024 None Detected  NONE DETECTED (Cut Off Level 100 ng/mL) Final   POC Marijuana UR 02/06/2024 Positive (A)  NONE DETECTED (Cut Off Level 50 ng/mL) Final  Admission on 02/06/2024, Discharged on 02/06/2024  Component Date Value Ref Range Status   WBC 02/06/2024 6.7  4.0 - 10.5 K/uL Final   RBC 02/06/2024 4.34  3.87 - 5.11 MIL/uL Final   Hemoglobin 02/06/2024 12.8  12.0 - 15.0 g/dL Final   HCT 87/89/7974 38.7  36.0 - 46.0 % Final   MCV 02/06/2024 89.2  80.0 - 100.0 fL Final   MCH 02/06/2024 29.5  26.0 - 34.0 pg Final   MCHC 02/06/2024 33.1  30.0 - 36.0 g/dL Final  RDW 02/06/2024  14.8  11.5 - 15.5 % Final   Platelets 02/06/2024 435 (H)  150 - 400 K/uL Final   nRBC 02/06/2024 0.0  0.0 - 0.2 % Final   Performed at [facility], [address], [place], [place] [address]   Sodium 02/06/2024 138  135 - 145 mmol/L Final   Potassium 02/06/2024 3.9  3.5 - 5.1 mmol/L Final   Chloride 02/06/2024 105  98 - 111 mmol/L Final   CO2 02/06/2024 24  22 - 32 mmol/L Final   Glucose, Bld 02/06/2024 92  70 - 99 mg/dL Final   Glucose reference range applies only to samples taken after fasting for at least 8 hours.   BUN 02/06/2024 12  6 - 20 mg/dL Final   Creatinine, Ser 02/06/2024 0.73  0.44 - 1.00 mg/dL Final   Calcium 87/89/7974 9.3  8.9 - 10.3 mg/dL Final   GFR, Estimated 02/06/2024 >60  >60 mL/min Final   Comment: (NOTE) Calculated using the CKD-EPI Creatinine Equation (2021)    Anion gap 02/06/2024 9  5 - 15 Final   Performed at [facility], [address] [place], [place] [address]   Total Protein 02/06/2024 7.1  6.5 - 8.1 g/dL Final   Albumin 87/89/7974 3.8  3.5 - 5.0 g/dL Final   AST 87/89/7974 22  15 - 41 U/L Final   ALT 02/06/2024 19  0 - 44 U/L Final   Alkaline Phosphatase 02/06/2024 42  38 - 126 U/L Final   Total Bilirubin 02/06/2024 0.4  0.0 - 1.2 mg/dL Final   Bilirubin, Direct 02/06/2024 <0.1  0.0 - 0.2 mg/dL Final   Indirect Bilirubin 02/06/2024 NOT CALCULATED  0.3 - 0.9 mg/dL Final   Performed at [facility], [address] [place], [place] [address]    Blood Alcohol level:  Lab Results  Component Value Date   [facility] <15 02/06/2024    Metabolic Disorder Labs: Lab Results  Component Value Date   HGBA1C 5.1 10/09/2021   MPG 99.67 10/09/2021   Lab Results  Component Value Date   PROLACTIN 8.4 02/06/2024   No results found for: CHOL, TRIG, HDL, CHOLHDL, VLDL, LDLCALC  Therapeutic Lab Levels: No results found for: LITHIUM No results found for: VALPROATE No results found for: CBMZ  Physical Findings   GAD-7     Flowsheet Row Routine [facility] Routine Prenatal from 10/13/2021 in [facility] at [facility] Routine Prenatal from 06/30/2021 in [facility] at [facility] Initial Prenatal from 06/01/2021 in [facility] at [facility] Video Visit from 05/18/2021 in [facility] at [facility]  Total GAD-7 Score 0 0 2 0 0   PHQ2-9    Flowsheet Row Routine Prenatal from 10/20/2021 in [facility] at [facility] for Women Routine Prenatal from 10/13/2021 in [facility] at [facility] for Women Routine Prenatal from 06/30/2021 in [facility] at [facility] Initial Prenatal from 06/01/2021 in [facility] at [facility] Video Visit from 05/18/2021 in [facility] at [facility] for Women  PHQ-2 Total Score 0 2 0 0 0  PHQ-9 Total Score 3 4 2 1  0   Flowsheet Row ED from 02/07/2024  in [facility] Most recent reading at 02/07/2024 11:00 PM ED from 02/06/2024 in [facility] Most recent reading at 02/06/2024  2:10 PM ED from 02/06/2024 in [facility] Emergency Department at [facility] Most recent reading at 02/06/2024  6:55 AM  C-SSRS RISK CATEGORY Low Risk Low Risk No Risk     Musculoskeletal  Strength & Muscle Tone: not assessed - pt laying in bed during this assessment Gait & Station: not assessed - pt laying in bed during this assessment Patient leans: N/A  Psychiatric Specialty Exam  Presentation  General Appearance: Fairly Groomed  Eye Contact:None  Speech:Clear and Coherent  Speech Volume:Decreased  Handedness:No data recorded  Mood and Affect   Mood:Irritable  Affect:No data recorded  Thought Process  Thought Processes:Coherent  Descriptions of Associations:Intact  Orientation:Full (Time, Place and Person)  Thought Content:Logical  Diagnosis of Schizophrenia or Schizoaffective disorder in past: No  Duration of Psychotic Symptoms: Less than six months   Hallucinations:Hallucinations: None  Ideas of Reference:None  Suicidal Thoughts:Suicidal Thoughts: No  Homicidal Thoughts:Homicidal Thoughts: No   Sensorium  Memory:Immediate Fair; Recent Fair  Judgment:Fair  Insight:Fair   Executive Functions  Concentration:Poor  Attention Span:Poor  Recall:Fair  Fund of Knowledge:Fair  Language:No data recorded  Psychomotor Activity  Psychomotor Activity:Psychomotor Activity: Normal   Assets  Assets:Communication Skills   Sleep  Sleep:Sleep: Fair  Estimated Sleeping Duration (Last 24 Hours): 12.75-13.50 hours  No data recorded  Physical Exam  Physical Exam Cardiovascular:     Rate and Rhythm: Normal rate.  Pulmonary:     Effort: Pulmonary effort is normal.  Skin:    General: Skin is warm and dry.  Neurological:     Mental Status: She is alert and oriented to person, place, and time.  Psychiatric:     Comments: See HPI    Review of Systems  Constitutional:  Negative for fever.  HENT:  Negative for congestion and sore throat.   Respiratory:  Negative for cough and shortness of breath.   Cardiovascular:  Negative for chest pain and palpitations.  Psychiatric/Behavioral:  Positive for substance abuse. Negative for suicidal ideas.    Blood pressure 115/75, pulse 92, temperature (!) 97.3 F (36.3 C), temperature source Oral, resp. rate 16, last menstrual period 01/28/2024, SpO2 100%. There is no height or weight on file to calculate BMI.  Treatment Plan Summary: Daily contact with patient to assess and evaluate symptoms and progress in treatment  Medication management Continue Tylenol  650 mg  p.o. every 6 hours as needed mild pain Continue agitation protocol as needed Continue hydroxyzine 25 mg p.o. 3 times a day as needed for anxiety or itching Continue milk of magnesia 30 mL p.o. daily as needed for mild constipation Continue nicotine 14 mg patch transdermally daily for smoking cessation Zyprexa  1.25 mg p.o. daily was ordered on 02/07/2024 hallucinations with first dose to be administered on 121/2.  Will gauge patient's response from this a dose and reassess on 12/13 and make dose adjustment as clinically indicated. Continue Zyprexa  2.5 mg p.o. daily nightly Continue trazodone 50 mg p.o. nightly as needed sleep Urine preg ordered - NEG  Plan Tentative discharge date of 12/17. Care coordination to assist with discharge planning.   Sherrell Culver, PMHNP-BC, FNP-BC  02/08/2024 4:43 PM

## 2024-02-08 NOTE — Group Note (Signed)
 Group Topic: Healthy Self Image and Positive Change  Group Date: 02/08/2024 Start Time: 1300 End Time: 1345 Facilitators: Alyse Leilani LABOR, NT  Department: [facility]  Number of Participants: 6  Group Focus: chemical dependency education, chemical dependency issues, clarity of thought, reality orientation, and relapse prevention Treatment Modality:  Psychoeducation Interventions utilized were group exercise Purpose: enhance coping skills, express feelings, improve communication skills, and increase insight  Name: Madison Soto Date of Birth: 03-20-1995  MR: [id number]   Pt did not attend group. 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

## 2024-02-08 NOTE — ED Notes (Signed)
 Patient resting in bed with eyes closed, respirations even and unlabored, no distress noted, will continue to monitor for safety

## 2024-02-08 NOTE — ED Notes (Signed)
 Pt sleeping in bed, no acute distress noted. Respirations even and unlabored. Continue to monitor for safety.

## 2024-02-08 NOTE — Care Plan (Signed)
 Interdisciplinary Treatment and Diagnostic Plan Update  02/08/2024 Time of Session: 2pm Madison Soto MRN: [id number]  Diagnosis:  Final diagnoses:  Substance-induced psychotic disorder (HCC)     Current Medications:  Current Facility-Administered Medications  Medication Dose Route Frequency Provider Last Rate Last Admin   acetaminophen  (TYLENOL ) tablet 650 mg  650 mg Oral Q6H PRN Delsie Lynwood Morene Lavone, MD   650 mg at 02/08/24 0232   alum & mag hydroxide-simeth (MAALOX/MYLANTA) 200-200-20 MG/5ML suspension 30 mL  30 mL Oral Q4H PRN Delsie Lynwood Morene Lavone, MD       haloperidol (HALDOL) tablet 5 mg  5 mg Oral TID PRN Delsie Lynwood Morene Lavone, MD       And   diphenhydrAMINE  (BENADRYL ) capsule 50 mg  50 mg Oral TID PRN Delsie Lynwood Morene Lavone, MD       haloperidol lactate (HALDOL) injection 5 mg  5 mg Intramuscular TID PRN Delsie Lynwood Morene Lavone, MD       And   diphenhydrAMINE  (BENADRYL ) injection 50 mg  50 mg Intramuscular TID PRN Delsie Lynwood Morene Lavone, MD       And   LORazepam (ATIVAN) injection 2 mg  2 mg Intramuscular TID PRN Delsie Lynwood Morene Lavone, MD       haloperidol lactate (HALDOL) injection 10 mg  10 mg Intramuscular TID PRN Delsie Lynwood Morene Lavone, MD       And   diphenhydrAMINE  (BENADRYL ) injection 50 mg  50 mg Intramuscular TID PRN Delsie Lynwood Morene Lavone, MD       And   LORazepam (ATIVAN) injection 2 mg  2 mg Intramuscular TID PRN Delsie Lynwood Morene Lavone, MD       hydrOXYzine (ATARAX) tablet 25 mg  25 mg Oral TID PRN Delsie Lynwood Morene Lavone, MD       magnesium hydroxide (MILK OF MAGNESIA) suspension 30 mL  30 mL Oral Daily PRN Delsie Lynwood Morene Lavone, MD       nicotine (NICODERM CQ - dosed in mg/24 hours) patch 14 mg  14 mg Transdermal Q0600 Delsie Lynwood Morene Lavone, MD   14 mg at 02/08/24 9050   OLANZapine  (ZYPREXA ) tablet 1.25 mg  1.25 mg Oral Daily Delsie Lynwood Morene Lavone, MD   1.25 mg at 02/08/24 9050   OLANZapine  (ZYPREXA ) tablet 2.5 mg  2.5 mg Oral QHS Delsie Lynwood Morene Lavone, MD       traZODone (DESYREL) tablet 50 mg  50 mg Oral QHS PRN,MR X 1 Wise, James Benjamin Stallworth, MD       Current Outpatient Medications  Medication Sig Dispense Refill   bacitracin  ointment Apply 1 Application topically 2 (two) times daily. (Patient not taking: Reported on 02/06/2024) 120 g 0   Chlorhexidine  Gluconate 2 % SOLN Apply 1 Application topically daily for 7 days. (Patient not taking: Reported on 02/06/2024) 473 mL 0   hydrOXYzine (ATARAX) 25 MG tablet Take 1 tablet (25 mg total) by mouth every 6 (six) hours. 12 tablet 0   permethrin  (ELIMITE ) 5 % cream Apply to affected area once (Patient taking differently: Apply 1 Application topically once. Apply to affected area once) 60 g 0   triamcinolone  cream (KENALOG ) 0.1 % Apply 1 Application topically 2 (two) times daily. 30 g 0   PTA Medications: Prior to Admission medications  Medication Sig Start Date End Date Taking? Authorizing Provider  bacitracin  ointment Apply 1 Application topically 2 (two) times daily. Patient not taking: Reported on 02/06/2024 01/24/24  Beatty, Celeste A, PA-C  Chlorhexidine  Gluconate 2 % SOLN Apply 1 Application topically daily for 7 days. Patient not taking: Reported on 02/06/2024 02/04/24 02/11/24  Mesner, Jason, MD  hydrOXYzine (ATARAX) 25 MG tablet Take 1 tablet (25 mg total) by mouth every 6 (six) hours. 02/06/24   Ula Prentice SAUNDERS, MD  permethrin  (ELIMITE ) 5 % cream Apply to affected area once Patient taking differently: Apply 1 Application topically once. Apply to affected area once 02/06/24   Ula Prentice SAUNDERS, MD  triamcinolone  cream (KENALOG ) 0.1 % Apply 1 Application topically 2 (two) times daily. 02/06/24   Ula Prentice SAUNDERS, MD    Patient Stressors: Health problems   Substance abuse    Patient Strengths: Ability for insight  Capable of independent living   Communication skills  Motivation for treatment/growth   Treatment Modalities: Medication Management, Group therapy, Case management,  1 to 1 session with clinician, Psychoeducation, Recreational therapy.   Physician Treatment Plan for Primary and Secondary Diagnosis:  Final diagnoses:  Substance-induced psychotic disorder (HCC)   Long Term Goal(s):    Short Term Goals:    Medication Management: Evaluate patient's response, side effects, and tolerance of medication regimen.  Therapeutic Interventions: 1 to 1 sessions, Unit Group sessions and Medication administration.  Evaluation of Outcomes: Progressing  LCSW Treatment Plan for Primary Diagnosis:  Final diagnoses:  Substance-induced psychotic disorder (HCC)    Long Term Goal(s): Safe transition to appropriate next level of care at discharge.  Short Term Goals: Facilitate acceptance of mental health diagnosis and concerns through verbal commitment to aftercare plan and appointments at discharge. and Increase skills for wellness and recovery by attending 50% of scheduled groups.  Therapeutic Interventions: Assess for all discharge needs, 1 to 1 time with Child psychotherapist, Explore available resources and support systems, Assess for adequacy in community support network, Educate family and significant other(s) on suicide prevention, Complete Psychosocial Assessment, Interpersonal group therapy.  Evaluation of Outcomes: Progressing   Progress in Treatment: Attending groups: Yes. Participating in groups: Yes. Taking medication as prescribed: Yes. Toleration medication: As evidenced by:  Client reports taking medication as prescribed and monitoring for withdrawals.   Family/Significant other contact made: Yes, individual(s) contacted:  Client reports making contact with family. Patient understands diagnosis: Yes. Discussing patient identified problems/goals with staff: Yes. Medical problems stabilized or resolved: As evidenced by:   Client's medical problems are currently stable but monitoring.  Denies suicidal/homicidal ideation: Yes. Issues/concerns per patient self-inventory: No. Other: na  New problem(s) identified: no, client is currently being monitored for abnormal EKG, Wolff-White syndrome ( heart concerns), paranoia regarding skin concerns, and possible scabies.  Client has been given doses of medical cream for scabies and her next dose is not due until after her discharge date.  Client will be given the prescription.    New Short Term/Long Term Goal(s): Gain sobriety and detox  Patient Goals:  Client wants to attend a 30 day or longer treatment facility for ecstasy and marijuana use.  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    Discharge Plan or Barriers: Client reports no barriers.   Reason for Continuation of Hospitalization: Depression Medical Issues Medication stabilization Withdrawal symptoms  Estimated Length of Stay:02/07/24 - 02/13/24  Last 3 [place] Suicide Severity Risk Score: Flowsheet Row ED from 02/07/2024 in [facility] Most recent reading at 02/07/2024 11:00 PM ED from 02/06/2024 in [facility] Most recent reading at 02/06/2024  2:10 PM ED from  02/06/2024 in [facility] Emergency Department at [facility] Most recent reading at 02/06/2024  6:55 AM  C-SSRS RISK CATEGORY Low Risk Low Risk No Risk    Last PHQ 2/9 Scores:    10/20/2021    1:30 PM 10/13/2021   11:22 AM 06/30/2021    4:14 PM  Depression screen PHQ 2/9  Decreased Interest 0 2 0  Down, Depressed, Hopeless 0 0 0  PHQ - 2 Score 0 2 0  Altered sleeping 0 0 0  Tired, decreased energy 3 2 2   Change in appetite 0 0 0  Feeling bad or failure about yourself  0 0 0  Trouble concentrating 0 0 0  Moving slowly or fidgety/restless 0 0 0  Suicidal thoughts 0 0 0  PHQ-9 Score 3  4  2       Data saved with a previous flowsheet  row definition    Scribe for Treatment Team: Zeus Marquis, LCSW 02/08/2024 3:03 PM

## 2024-02-08 NOTE — Care Management (Addendum)
 [facility] Care Management...  Addendum 2:55 pm  Per Olivia @ [facility] unable to accept patient needs to be cleared medically  Per Randine at [facility], verified patients insurance. Patient can call to complete phone screening  Writer provided patient with [facility] contact number    Writer spoke with [facility] @ [facility]  Per Rosaline, patient is under review

## 2024-02-08 NOTE — Care Management (Signed)
 [facility] Care Management...   Writer met with the client to discuss discharge planning.  Writer informed the Client the average stay at [facility] is 3-5 days.  Client reports her primary drug of choice is Ecstasy.  Client also reports marijuana use.  Client reports within the past three months she has loss custody of her minor children to her child's father.    Client reports she's interested in 30 days or longer residential treatment for substance abuse/mental health.    Writer faxed referrals to [facility], and [facility] [place].    Client has Court on 03/24/24 for: Civil Domestic without Claim for absolute divorce.

## 2024-02-08 NOTE — ED Notes (Addendum)
 Patient denies SI/HI, took all scheduled meds and PRN for anxiety,VSS. Patient still complains of pain, PRN given earlier, patient resting at this time.Patient did not attend AA meeting. Patient stays isolated in room and is irritable and anxious when interacting with peers. Will continue to monitor for safety

## 2024-02-09 DIAGNOSIS — R45851 Suicidal ideations: Secondary | ICD-10-CM | POA: Diagnosis not present

## 2024-02-09 DIAGNOSIS — F19959 Other psychoactive substance use, unspecified with psychoactive substance-induced psychotic disorder, unspecified: Secondary | ICD-10-CM | POA: Diagnosis not present

## 2024-02-09 DIAGNOSIS — R442 Other hallucinations: Secondary | ICD-10-CM | POA: Diagnosis not present

## 2024-02-09 DIAGNOSIS — F1520 Other stimulant dependence, uncomplicated: Secondary | ICD-10-CM | POA: Diagnosis not present

## 2024-02-09 LAB — HIV ANTIBODY (ROUTINE TESTING W REFLEX): HIV Screen 4th Generation wRfx: NONREACTIVE

## 2024-02-09 MED ORDER — HYDROXYZINE HCL 25 MG PO TABS
25.0000 mg | ORAL_TABLET | Freq: Three times a day (TID) | ORAL | Status: DC
  Administered 2024-02-09 – 2024-02-12 (×11): 25 mg via ORAL
  Filled 2024-02-09 (×8): qty 1
  Filled 2024-02-09: qty 21
  Filled 2024-02-09 (×3): qty 1

## 2024-02-09 MED ORDER — HYDROCORTISONE 1 % EX OINT
TOPICAL_OINTMENT | Freq: Two times a day (BID) | CUTANEOUS | Status: DC
  Filled 2024-02-09: qty 28

## 2024-02-09 MED ORDER — HYDROCORTISONE 1 % EX CREA
TOPICAL_CREAM | Freq: Two times a day (BID) | CUTANEOUS | Status: AC
  Administered 2024-02-09 – 2024-02-11 (×3): 1 via TOPICAL

## 2024-02-09 NOTE — ED Notes (Signed)
 Pt resting in bed, no acute distress noted. Respirations even and unlabored. Continue to monitor for safety.

## 2024-02-09 NOTE — ED Provider Notes (Signed)
 Facility Based Crisis [facility]) Behavioral Health Progress Note  Date and Time: 02/09/2024 11:39 AM Name: Madison Soto MRN:  [id number]  Subjective:  I'm still itching   Diagnosis:  Final diagnoses:  Substance-induced psychotic disorder (HCC)  Tactile hallucination  WPW (Wolff-Parkinson-White syndrome)  Methamphetamine dependence (HCC)  History of methylenedioxymethamphetamine (MDMA) use   Chart reviewed and discussed with attending psychiatrist, Dr Garvin Gaines.  Pt is seen face-to-face on the Facility Based Crisis [facility]) unit. Pt is alert & oriented x 4 and engages in evaluation. Today, pt states I'm still itching. Pt was treated with permethrin  on 12/10 for scabies. Pt endorses improvement in severity of itching after permethrin  treatment. She continues to endorse itching to dorsal surface of hands, web spaces of fingers, and face. She states she slept well overnight. Appetite is good. Denies suicidal or homicidal ideation, intent or plan. Denies AVH. She denies chest pain or shortness of breath. Discussed EKG and possible WPW findings. Denies previous cardiac history, history of congenital heart conditions and denies any family history of the same.   Total Time spent with patient: 15 minutes  Past Psychiatric History: Previous Psych Diagnoses: No previous diagnoses Prior inpatient treatment: Denies Current/prior outpatient treatment: None Prior rehab hx: No history Psychotherapy hx: No history History of suicide: Denies any attempts History of homicide or aggression: Denies any history of aggression Psychiatric medication history: Never been on any mood medicines Psychiatric medication compliance history: Never Neuromodulation history: Never Current Psychiatrist: Never Current therapist: Never Past Medical History: Eczema, Asthma in childhood Family History: Medical: paternal aunt has breast cancer,paternal grandfather and grandmother have HTN, paternal grandmother has  diabetes.  Strong family history of systemic lupus erythematosus (SLE). Psych: Per patient's aunt: patient's father is reported to have had bipolar disorder, mother reportedly has schizophrenia/bipolar disorder Psych Rx: Unknown SA/HA: Denies Substance use family hx: Unsure   Social History: Childhood (bring, raised, lives now, parents, siblings, schooling, education): Born in [place] , raised in [place]   has lived here ever since.  Patient was unable to complete high school, went and lived with a boyfriend got pregnant and never finished school Abuse: Patient equivocates on this question Marital Status: Never married Sexual orientation: Prefers men Children: Age 62 lives with aunt, age 75, age 68 both live with their father Employment: Patient is working as a prostitute.  Previously she worked in [employer]: Denies good influences Housing: Has been living in hotels Finances: Glass Blower/designer: Denies Public Librarian: No affiliation  Additional Social History:   Sleep: Good  Appetite:  Good  Current Medications:  Current Facility-Administered Medications  Medication Dose Route Frequency Provider Last Rate Last Admin   acetaminophen  (TYLENOL ) tablet 650 mg  650 mg Oral Q6H PRN Delsie Lynwood Morene Lavone, MD   650 mg at 02/08/24 1659   alum & mag hydroxide-simeth (MAALOX/MYLANTA) 200-200-20 MG/5ML suspension 30 mL  30 mL Oral Q4H PRN Delsie Lynwood Morene Lavone, MD       haloperidol  (HALDOL ) tablet 5 mg  5 mg Oral TID PRN Delsie Lynwood Morene Lavone, MD       And   diphenhydrAMINE  (BENADRYL ) capsule 50 mg  50 mg Oral TID PRN Delsie Lynwood Morene Lavone, MD       haloperidol  lactate (HALDOL ) injection 5 mg  5 mg Intramuscular TID PRN Delsie Lynwood Morene Lavone, MD       And   diphenhydrAMINE  (BENADRYL ) injection 50 mg  50 mg Intramuscular TID PRN Delsie Lynwood Morene Lavone, MD  And   LORazepam  (ATIVAN ) injection 2 mg   2 mg Intramuscular TID PRN Wise, James Benjamin Stallworth, MD       haloperidol  lactate (HALDOL ) injection 10 mg  10 mg Intramuscular TID PRN Delsie Lynwood Morene Lavone, MD       And   diphenhydrAMINE  (BENADRYL ) injection 50 mg  50 mg Intramuscular TID PRN Delsie Lynwood Morene Lavone, MD       And   LORazepam  (ATIVAN ) injection 2 mg  2 mg Intramuscular TID PRN Delsie Lynwood Morene Lavone, MD       hydrOXYzine  (ATARAX ) tablet 25 mg  25 mg Oral TID PRN Delsie Lynwood Morene Lavone, MD   25 mg at 02/08/24 2034   magnesium  hydroxide (MILK OF MAGNESIA) suspension 30 mL  30 mL Oral Daily PRN Delsie Lynwood Morene Lavone, MD       nicotine  (NICODERM CQ  - dosed in mg/24 hours) patch 14 mg  14 mg Transdermal Q0600 Delsie Lynwood Morene Lavone, MD   14 mg at 02/08/24 9050   OLANZapine  (ZYPREXA ) tablet 1.25 mg  1.25 mg Oral Daily Delsie Lynwood Morene Lavone, MD   1.25 mg at 02/08/24 9050   OLANZapine  (ZYPREXA ) tablet 2.5 mg  2.5 mg Oral QHS Delsie Lynwood Morene Lavone, MD   2.5 mg at 02/08/24 2121   traZODone  (DESYREL ) tablet 50 mg  50 mg Oral QHS PRN,MR X 1 Delsie Lynwood Morene Lavone, MD   50 mg at 02/08/24 2120   Current Outpatient Medications  Medication Sig Dispense Refill   bacitracin  ointment Apply 1 Application topically 2 (two) times daily. (Patient not taking: Reported on 02/06/2024) 120 g 0   Chlorhexidine  Gluconate 2 % SOLN Apply 1 Application topically daily for 7 days. (Patient not taking: Reported on 02/06/2024) 473 mL 0   hydrOXYzine  (ATARAX ) 25 MG tablet Take 1 tablet (25 mg total) by mouth every 6 (six) hours. 12 tablet 0   permethrin  (ELIMITE ) 5 % cream Apply to affected area once (Patient taking differently: Apply 1 Application topically once. Apply to affected area once) 60 g 0   triamcinolone  cream (KENALOG ) 0.1 % Apply 1 Application topically 2 (two) times daily. 30 g 0    Labs  Lab Results:  Admission on 02/07/2024  Component Date Value Ref  Range Status   Preg Test, Ur 02/08/2024 Negative  Negative Final  Admission on 02/06/2024, Discharged on 02/07/2024  Component Date Value Ref Range Status   Alcohol, Ethyl (B) 02/06/2024 <15  <15 mg/dL Final   Comment: (NOTE) For medical purposes only. Performed at [facility], [address], [place], [place] [address]    TSH 02/06/2024 1.166  0.350 - 4.500 uIU/mL Final   Comment: Performed by a 3rd Generation assay with a functional sensitivity of <=0.01 uIU/mL. Performed at [facility], [address] [place], [place] [address]    Prolactin 02/06/2024 8.4  4.8 - 33.4 ng/mL Final   Comment: (NOTE) Performed At: [facility] [address] [place], [place] [id number] Jennette Shorter MD Ey:[id number]    Neisseria Gonorrhea 02/06/2024 Negative   Final   Chlamydia 02/06/2024 Negative   Final   Comment 02/06/2024 Normal Reference Ranger Chlamydia - Negative   Final   Comment 02/06/2024 Normal Reference Range Neisseria Gonorrhea - Negative   Final   RPR Ser Ql 02/06/2024 NON REACTIVE  NON REACTIVE Final   Performed at [facility], [address], [place], [place] [address]   ANA Ab, LARNA 02/06/2024  Negative   Final   Comment: (NOTE)                                     Negative   <1:80                                     Borderline  1:80                                     Positive   >1:80 ICAP nomenclature: AC-0 For more information about Hep-2 cell patterns use ANApatterns.org, the official website for the International Consensus on Antinuclear Antibody (ANA) Patterns (ICAP). Performed At: [facility] [address] [place], [place] [id number] Jennette Shorter MD Ey:[id number]    Sed Rate 02/06/2024 4  0 - 22 mm/hr Final   Performed at [facility], [address], [place], [place] [address]   CRP 02/06/2024 <0.5  <1.0 mg/dL Final   Performed at [facility], [address], [place], [place] [address]   POC Amphetamine UR  02/06/2024 Positive (A)  NONE DETECTED (Cut Off Level 1000 ng/mL) Final   POC Secobarbital (BAR) 02/06/2024 None Detected  NONE DETECTED (Cut Off Level 300 ng/mL) Final   POC Buprenorphine (BUP) 02/06/2024 None Detected  NONE DETECTED (Cut Off Level 10 ng/mL) Final   POC Oxazepam (BZO) 02/06/2024 None Detected  NONE DETECTED (Cut Off Level 300 ng/mL) Final   POC Cocaine UR 02/06/2024 None Detected  NONE DETECTED (Cut Off Level 300 ng/mL) Final   POC Methamphetamine UR 02/06/2024 Positive (A)  NONE DETECTED (Cut Off Level 1000 ng/mL) Final   POC Morphine 02/06/2024 None Detected  NONE DETECTED (Cut Off Level 300 ng/mL) Final   POC Methadone UR 02/06/2024 None Detected  NONE DETECTED (Cut Off Level 300 ng/mL) Final   POC Oxycodone  UR 02/06/2024 None Detected  NONE DETECTED (Cut Off Level 100 ng/mL) Final   POC Marijuana UR 02/06/2024 Positive (A)  NONE DETECTED (Cut Off Level 50 ng/mL) Final  Admission on 02/06/2024, Discharged on 02/06/2024  Component Date Value Ref Range Status   WBC 02/06/2024 6.7  4.0 - 10.5 K/uL Final   RBC 02/06/2024 4.34  3.87 - 5.11 MIL/uL Final   Hemoglobin 02/06/2024 12.8  12.0 - 15.0 g/dL Final   HCT 87/89/7974 38.7  36.0 - 46.0 % Final   MCV 02/06/2024 89.2  80.0 - 100.0 fL Final   MCH 02/06/2024 29.5  26.0 - 34.0 pg Final   MCHC 02/06/2024 33.1  30.0 - 36.0 g/dL Final   RDW 87/89/7974 14.8  11.5 - 15.5 % Final   Platelets 02/06/2024 435 (H)  150 - 400 K/uL Final   nRBC 02/06/2024 0.0  0.0 - 0.2 % Final   Performed at [facility], [address], [place], [place] [address]   Sodium 02/06/2024 138  135 - 145 mmol/L Final   Potassium 02/06/2024 3.9  3.5 - 5.1 mmol/L Final   Chloride 02/06/2024 105  98 - 111 mmol/L Final   CO2 02/06/2024 24  22 - 32 mmol/L Final   Glucose, Bld 02/06/2024 92  70 - 99 mg/dL Final   Glucose reference range applies only to samples taken after fasting for at least  8 hours.   BUN 02/06/2024 12  6 - 20 mg/dL Final   Creatinine,  Ser 02/06/2024 0.73  0.44 - 1.00 mg/dL Final   Calcium 87/89/7974 9.3  8.9 - 10.3 mg/dL Final   GFR, Estimated 02/06/2024 >60  >60 mL/min Final   Comment: (NOTE) Calculated using the CKD-EPI Creatinine Equation (2021)    Anion gap 02/06/2024 9  5 - 15 Final   Performed at [facility], [address], [place], [place] [address]   Total Protein 02/06/2024 7.1  6.5 - 8.1 g/dL Final   Albumin 87/89/7974 3.8  3.5 - 5.0 g/dL Final   AST 87/89/7974 22  15 - 41 U/L Final   ALT 02/06/2024 19  0 - 44 U/L Final   Alkaline Phosphatase 02/06/2024 42  38 - 126 U/L Final   Total Bilirubin 02/06/2024 0.4  0.0 - 1.2 mg/dL Final   Bilirubin, Direct 02/06/2024 <0.1  0.0 - 0.2 mg/dL Final   Indirect Bilirubin 02/06/2024 NOT CALCULATED  0.3 - 0.9 mg/dL Final   Performed at [facility], [address] [place], [place] [address]    Blood Alcohol level:  Lab Results  Component Value Date   [facility] <15 02/06/2024    Metabolic Disorder Labs: Lab Results  Component Value Date   HGBA1C 5.1 10/09/2021   MPG 99.67 10/09/2021   Lab Results  Component Value Date   PROLACTIN 8.4 02/06/2024   No results found for: CHOL, TRIG, HDL, CHOLHDL, VLDL, LDLCALC  Therapeutic Lab Levels: No results found for: LITHIUM No results found for: VALPROATE No results found for: CBMZ  Physical Findings   GAD-7    Flowsheet Row Routine [facility] Routine Prenatal from 10/13/2021 in [facility] at [facility] Routine Prenatal from 06/30/2021 in [facility] at [facility] Initial Prenatal from 06/01/2021 in [facility] at [facility] Video Visit from 05/18/2021 in [facility] at [facility]  Total GAD-7 Score 0 0 2 0 0   PHQ2-9    Flowsheet Row Routine Prenatal from  10/20/2021 in [facility] [employer] at [facility] Routine Prenatal from 10/13/2021 in [facility] at [facility] Routine Prenatal from 06/30/2021 in [facility] at [facility] Initial Prenatal from 06/01/2021 in [facility] at [facility] Video Visit from 05/18/2021 in [facility] [employer] [facility]  PHQ-2 Total Score 0 2 0 0 0  PHQ-9 Total Score 3 4 2 1  0   Flowsheet Row ED from 02/07/2024 in [facility] Most recent reading at 02/07/2024 11:00 PM ED from 02/06/2024 in [facility] Most recent reading at 02/06/2024  2:10 PM ED from 02/06/2024 in [facility] at [facility] Most recent reading at 02/06/2024  6:55 AM  C-SSRS RISK CATEGORY Low Risk Low Risk No Risk     Musculoskeletal  Strength & Muscle Tone: within normal limits Gait & Station: normal Patient leans: N/A  Psychiatric Specialty Exam  Presentation  General Appearance:  Fairly Groomed  Eye Contact: None  Speech: Clear and Coherent  Speech Volume: Decreased  Handedness:No data recorded  Mood and Affect  Mood: Irritable  Affect:No  data recorded  Thought Process  Thought Processes: Coherent  Descriptions of Associations:Intact  Orientation:Full (Time, Place and Person)  Thought Content:Logical  Diagnosis of Schizophrenia or Schizoaffective disorder in past: No  Duration of Psychotic Symptoms: Less than six months   Hallucinations:Hallucinations: None  Ideas of Reference:None  Suicidal Thoughts:Suicidal Thoughts: No  Homicidal Thoughts:Homicidal Thoughts: No   Sensorium  Memory: Immediate Fair; Recent Fair  Judgment: Fair  Insight: Fair   Art Therapist  Concentration: Poor  Attention Span: Poor  Recall: Fair  Fund of  Knowledge: Fair  Language:No data recorded  Psychomotor Activity  Psychomotor Activity: Psychomotor Activity: Normal   Assets  Assets: Communication Skills   Sleep  Sleep: Sleep: Fair  Estimated Sleeping Duration (Last 24 Hours): 17.25 hours  No data recorded  Physical Exam  Physical Exam Vitals and nursing note reviewed.  HENT:     Head: Normocephalic.     Mouth/Throat:     Mouth: Mucous membranes are moist.  Cardiovascular:     Rate and Rhythm: Normal rate.  Pulmonary:     Effort: Pulmonary effort is normal.  Musculoskeletal:        General: Normal range of motion.  Skin:    General: Skin is warm and dry.  Neurological:     Mental Status: She is alert and oriented to person, place, and time.  Psychiatric:     Comments: See HPI    Review of Systems  Constitutional:  Negative for chills and fever.  HENT:  Negative for congestion and sore throat.   Respiratory:  Negative for cough and shortness of breath.   Cardiovascular:  Negative for chest pain and palpitations.  Gastrointestinal:  Negative for diarrhea, nausea and vomiting.  Skin:  Positive for rash.       Areas of hyperpigmentation noted to forehead, bilateral cheeks, and to the dorsal surface of bilateral hands.   Psychiatric/Behavioral:  Positive for substance abuse. Negative for depression, hallucinations and suicidal ideas.    Blood pressure 129/79, pulse 89, temperature (!) 97.4 F (36.3 C), resp. rate 17, last menstrual period 01/28/2024, SpO2 100%. There is no height or weight on file to calculate BMI.  Treatment Plan Summary: Daily contact with patient to assess and evaluate symptoms and progress in treatment  Medication management  Continue Tylenol  650 mg p.o. every 6 hours as needed mild pain Continue agitation protocol as needed Continue hydroxyzine  25 mg p.o. 3 times a day as needed for anxiety or itching Continue milk of magnesia 30 mL p.o. daily as needed for mild  constipation Continue nicotine  14 mg patch transdermally daily for smoking cessation Continue trazodone  50 mg p.o. nightly as needed sleep Urine preg ordered - NEG Discontinue olanzapine  due to possible WPW Permethrin  lotion should be repeated on 02/13/24 if symptoms of scabies infestation persists Will schedule Hydroxyzine  25 mg PO TID for itching Start hydrocortisone  1% topically BID to forehead, dorsal surface of hands and webbing space of fingers x 3 days  Plan Tentative discharge date of 12/17. Care coordination to assist with discharge planning. Will need referral to cardiology for follow-up for EKGs showing WPW  Sherrell Culver, PMHNP-BC, FNP-BC  02/09/2024 11:39 AM

## 2024-02-09 NOTE — Group Note (Signed)
 Group Topic: Relapse and Recovery  Group Date: 02/09/2024 Start Time: 0830 End Time: 0900 Facilitators: Belle Camellia SAILOR, RN  Department: [facility]  Number of Participants: 8  Group Focus: substance abuse education Treatment Modality:  Solution-Focused Therapy Interventions utilized were patient education Purpose: relapse prevention strategies  Name: JORDI KAMM Date of Birth: September 20, 1995  MR: [id number]    Level of Participation: minimal Quality of Participation: cooperative Interactions with others: gave feedback Mood/Affect: appropriate Triggers (if applicable):  Cognition: coherent/clear Progress: Minimal Response:  Plan: follow-up needed  Patients Problems:  Patient Active Problem List   Diagnosis Date Noted   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

## 2024-02-09 NOTE — ED Notes (Signed)
 Pt resting quietly in room.  Breathing even and unlabored. No distress noted.

## 2024-02-09 NOTE — ED Notes (Signed)
 Pt denies SI/HI/AVH. Headache managed with prn tylenol . Compliant with scheduled meds; prn trazodone  given. Pt did not attend AA group this evening. She was calm, cooperative, depressed, and labile. No behavioral concerns at this time. Currently sleeping in bed, no acute distress noted. Respirations even and unlabored. Continue to monitor for safety.

## 2024-02-09 NOTE — Group Note (Signed)
 Group Topic: Recovery Basics  Group Date: 02/09/2024 Start Time: 2000 End Time: 2030 Facilitators: Verdon Jacqualyn BRAVO, NT  Department: [facility]  Number of Participants: 9  Group Focus: chemical dependency issues Treatment Modality:  Individual Therapy Interventions utilized were group exercise Purpose: relapse prevention strategies  Name: Madison Soto Date of Birth: 1995-11-12  MR: [id number]    Level of Participation: active Quality of Participation: cooperative Interactions with others: gave feedback Mood/Affect: appropriate Triggers (if applicable): n/a Cognition: coherent/clear Progress: Moderate Response: n/a Plan: follow-up needed  Patients Problems:  Patient Active Problem List   Diagnosis Date Noted   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

## 2024-02-09 NOTE — Group Note (Signed)
 Group Topic: Healthy Self Image and Positive Change  Group Date: 02/09/2024 Start Time: 1300 End Time: 1330 Facilitators: Laneta Renea POUR, NT  Department: [facility]  Number of Participants: 2  Group Focus: coping skills Treatment Modality:  Psychoeducation Interventions utilized were problem solving Purpose: enhance coping skills and increase insight  Name: Madison Soto Date of Birth: Jul 02, 1995  MR: [id number]    Did not attend group.   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

## 2024-02-09 NOTE — ED Notes (Signed)
 Pt complaining of itching on her hand this morning.  She is asking for  something to help with this.   MD made aware.

## 2024-02-09 NOTE — ED Notes (Signed)
 Patient resting in bed with eyes closed, respirations even and unlabored, no distress noted, will continue to monitor for safety

## 2024-02-09 NOTE — Group Note (Deleted)
 Group Topic: Relapse and Recovery  Group Date: 02/09/2024 Start Time: 0830 End Time: 0900 Facilitators: Belle Camellia SAILOR, RN  Department: [facility]  Number of Participants: 8  Group Focus: abuse issues Treatment Modality:  Solution-Focused Therapy Interventions utilized were problem solving Purpose: increase insight, relapse prevention strategies, and trigger / craving management  Name: Madison Soto Date of Birth: 1995-10-13  MR: [id number]    Level of Participation: minimal Quality of Participation: cooperative Interactions with others: gave feedback Mood/Affect: appropriate Triggers (if applicable):  Cognition: logical Progress: Minimal Response:  Plan: follow-up needed  Patients Problems:  Patient Active Problem List   Diagnosis Date Noted   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

## 2024-02-10 DIAGNOSIS — F1520 Other stimulant dependence, uncomplicated: Secondary | ICD-10-CM | POA: Diagnosis not present

## 2024-02-10 DIAGNOSIS — R442 Other hallucinations: Secondary | ICD-10-CM | POA: Diagnosis not present

## 2024-02-10 DIAGNOSIS — F19959 Other psychoactive substance use, unspecified with psychoactive substance-induced psychotic disorder, unspecified: Secondary | ICD-10-CM | POA: Diagnosis not present

## 2024-02-10 DIAGNOSIS — R45851 Suicidal ideations: Secondary | ICD-10-CM | POA: Diagnosis not present

## 2024-02-10 MED ORDER — MELATONIN 3 MG PO TABS
3.0000 mg | ORAL_TABLET | Freq: Every evening | ORAL | Status: DC | PRN
  Administered 2024-02-10 – 2024-02-12 (×3): 3 mg via ORAL
  Filled 2024-02-10: qty 1
  Filled 2024-02-10: qty 7
  Filled 2024-02-10 (×2): qty 1

## 2024-02-10 NOTE — ED Notes (Signed)
 Pt sleeping in bed, no acute distress noted. Respirations even and unlabored. Continue to monitor for safety.

## 2024-02-10 NOTE — ED Notes (Signed)
 Pt denies SI/HI/AVH. Headache managed with prn tylenol . Compliant with scheduled meds; prn melatonin given. She was calm, cooperative, and pleasant. No behavioral concerns at this time. Currently sleeping in bed, no acute distress noted. Respirations even and unlabored. Continue to monitor for safety.

## 2024-02-10 NOTE — ED Notes (Signed)
 Pt provided with snack. Pt c/o level 4/10 headache, prn tylenol  administered.

## 2024-02-10 NOTE — Group Note (Signed)
 Group Topic: Understanding Self  Group Date: 02/10/2024 Start Time: 1210 End Time: 1245 Facilitators: Daved [name] HERO, RN  Department: [facility]  Number of Participants: 8  Group Focus: psychiatric education Treatment Modality:  Psychoeducation Interventions utilized were exploration Purpose: increase insight  Name: Madison Soto Date of Birth: [birth year]  MR: [id number]    Level of Participation: active Quality of Participation: attentive and cooperative Interactions with others: gave feedback Mood/Affect: appropriate Triggers (if applicable): n/a Cognition: coherent/clear Progress: Gaining insight Response: pt reports laughing and utilizing humor as a self care/ protective factor that would be helpful to them  Plan: patient will be encouraged to attend future RN education groups  Patients Problems:  Patient Active Problem List   Diagnosis Date Noted   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

## 2024-02-10 NOTE — ED Provider Notes (Signed)
 Behavioral Health Progress Note  Date and Time: 02/10/2024 4:32 PM Name: Madison Soto MRN:  [id number]   H&P by Dr. Lynwood Bash on 12/11/02025: Madison Soto is a 28 year old female with minimal past psychiatric history who presents from home after a 60-month period worsening depressive symptoms, obsessive symptoms, paranoia, delusional symptoms that coincide with 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   Subjective: Patient is seen face-to-face by this provider, consulted with Dr. Lawrnce and chart reviewed on 02/10/2024. On evaluation, patient reports that her itching has significantly reduced after using permethrin  cream for scabies. She reports that the hydrocortisone  cream has been okay. She reports also keeping her hands constantly moisturized to reduce itching. Areas where she is still experiencing mild pruritus are her hands, heels of both feet, and her chest. Notes that the itching to her face has decreased. Reports an okay appetite and doing good. Reports that she slept kind of good last night because she woke up and then had trouble falling back asleep. Reports that she had things on her mind like her 3 young kids. She denies any withdrawal symptoms. She reports that her appetite has improved because with her drug use, she normally deprives herself. Reports feeling fatigued and restless at times contributed to anxiety rated a 3 on a scale of 0-10 (10 being the worst). Discussed taking her current prn medication of atarax  25 mg tid as needed for anxiety. Denies any side effects from her medications. She reports no prior hx of attending rehab programs and displays interest in attending a rehab program after detox. Informed patient that the case management team will be following up tomorrow regarding referrals to substance abuse rehab treatment programs. She is alert and oriented x 4 during her assessment. She is in no acute distress. Her mood is anxious and her affect is congruent with her  affect. Her speech is clear, coherent, and logical. Objectively, there is no evidence of psychosis, mania, or delusional thinking. She doesn't appear to be responding to internal/external stimuli. She is able to converse coherently with goal-directed thoughts, no distractibility, or pre-occupation. She denies suicidal and homicidal ideations. She denies auditory and visual hallucinations. She denies paranoia.   Diagnosis:  Final diagnoses:  Substance-induced psychotic disorder (HCC)  Tactile hallucination  WPW (Wolff-Parkinson-White syndrome)  Methamphetamine dependence (HCC)  History of methylenedioxymethamphetamine (MDMA) use    Total Time spent with patient: 15 minutes  Past Psychiatric History: No previous diagnoses. Past Medical History:  Past Medical History:  Diagnosis Date   Asthma    childhood. patient was told she grew out of it'   Family History: Medical: paternal aunt has breast cancer,paternal grandfather and grandmother have HTN, paternal grandmother has diabetes. Strong family history of systemic lupus erythematosus (SLE). Family Psychiatric  History: Per patient's aunt: patient's father is reported to have had bipolar disorder, mother reportedly has schizophrenia/bipolar disorder Social History: Born in [place] , raised in [place]   has lived here ever since. Patient was unable to complete high school, went and lived with a boyfriend got pregnant and never finished school. Has a 63 year old son that lives with her aunt and then a 27 and 22 year old that live with her father. 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 Has been living on hotels.    Sleep: Fair  Appetite:  Good  Current Medications:  Current Facility-Administered Medications  Medication Dose Route Frequency Provider Last Rate Last Admin   acetaminophen  (TYLENOL ) tablet 650 mg  650 mg  Oral Q6H PRN Delsie Lynwood Morene Lavone, MD   650 mg at 02/10/24 1456    alum & mag hydroxide-simeth (MAALOX/MYLANTA) 200-200-20 MG/5ML suspension 30 mL  30 mL Oral Q4H PRN Delsie Lynwood Morene Lavone, MD       haloperidol  (HALDOL ) tablet 5 mg  5 mg Oral TID PRN Delsie Lynwood Morene Lavone, MD       And   diphenhydrAMINE  (BENADRYL ) capsule 50 mg  50 mg Oral TID PRN Delsie Lynwood Morene Lavone, MD       haloperidol  lactate (HALDOL ) injection 5 mg  5 mg Intramuscular TID PRN Delsie Lynwood Morene Lavone, MD       And   diphenhydrAMINE  (BENADRYL ) injection 50 mg  50 mg Intramuscular TID PRN Delsie Lynwood Morene Lavone, MD       And   LORazepam  (ATIVAN ) injection 2 mg  2 mg Intramuscular TID PRN Delsie Lynwood Morene Lavone, MD       haloperidol  lactate (HALDOL ) injection 10 mg  10 mg Intramuscular TID PRN Delsie Lynwood Morene Lavone, MD       And   diphenhydrAMINE  (BENADRYL ) injection 50 mg  50 mg Intramuscular TID PRN Delsie Lynwood Morene Lavone, MD       And   LORazepam  (ATIVAN ) injection 2 mg  2 mg Intramuscular TID PRN Delsie Lynwood Morene Lavone, MD       hydrocortisone  cream 1 %   Topical BID Hobson, Fran E, NP   1 Application at 02/10/24 9089   hydrOXYzine  (ATARAX ) tablet 25 mg  25 mg Oral TID Hobson, Fran E, NP   25 mg at 02/10/24 1510   magnesium  hydroxide (MILK OF MAGNESIA) suspension 30 mL  30 mL Oral Daily PRN Delsie Lynwood Morene Lavone, MD       nicotine  (NICODERM CQ  - dosed in mg/24 hours) patch 14 mg  14 mg Transdermal Q0600 Delsie Lynwood Morene Lavone, MD   14 mg at 02/10/24 0912   OLANZapine  (ZYPREXA ) tablet 1.25 mg  1.25 mg Oral Daily Delsie Lynwood Morene Lavone, MD   1.25 mg at 02/10/24 9086   OLANZapine  (ZYPREXA ) tablet 2.5 mg  2.5 mg Oral QHS Delsie Lynwood Morene Lavone, MD   2.5 mg at 02/09/24 2110   traZODone  (DESYREL ) tablet 50 mg  50 mg Oral QHS PRN,MR X 1 Delsie Lynwood Morene Lavone, MD   50 mg at 02/09/24 2112   Current Outpatient Medications  Medication Sig Dispense Refill    bacitracin  ointment Apply 1 Application topically 2 (two) times daily. (Patient not taking: Reported on 02/06/2024) 120 g 0   Chlorhexidine  Gluconate 2 % SOLN Apply 1 Application topically daily for 7 days. (Patient not taking: Reported on 02/06/2024) 473 mL 0   hydrOXYzine  (ATARAX ) 25 MG tablet Take 1 tablet (25 mg total) by mouth every 6 (six) hours. 12 tablet 0   permethrin  (ELIMITE ) 5 % cream Apply to affected area once (Patient taking differently: Apply 1 Application topically once. Apply to affected area once) 60 g 0   triamcinolone  cream (KENALOG ) 0.1 % Apply 1 Application topically 2 (two) times daily. 30 g 0    Labs  Lab Results:  Admission on 02/07/2024  Component Date Value Ref Range Status   HIV Screen 4th Generation wRfx 02/09/2024 Non Reactive  Non Reactive Final   Performed at [facility], [address], [place], [place] [address]   Preg Test, Ur 02/08/2024 Negative  Negative Final  Admission on 02/06/2024, Discharged on 02/07/2024  Component  Date Value Ref Range Status   Alcohol, Ethyl (B) 02/06/2024 <15  <15 mg/dL Final   Comment: (NOTE) For medical purposes only. Performed at [facility], [address], [place], [place] [address]    TSH 02/06/2024 1.166  0.350 - 4.500 uIU/mL Final   Comment: Performed by a 3rd Generation assay with a functional sensitivity of <=0.01 uIU/mL. Performed at [facility], [address] [place], [place] [address]    Prolactin 02/06/2024 8.4  4.8 - 33.4 ng/mL Final   Comment: (NOTE) Performed At: [facility] [address] [place], [place] [id number] Jennette Shorter MD Ey:[id number]    Neisseria Gonorrhea 02/06/2024 Negative   Final   Chlamydia 02/06/2024 Negative   Final   Comment 02/06/2024 Normal Reference Ranger Chlamydia - Negative   Final   Comment 02/06/2024 Normal Reference Range Neisseria Gonorrhea - Negative   Final   RPR Ser Ql 02/06/2024 NON REACTIVE  NON REACTIVE Final   Performed at [facility], [address] [place], [place] [address]   ANA Ab, IFA 02/06/2024 Negative   Final   Comment: (NOTE)                                     Negative   <1:80                                     Borderline  1:80                                     Positive   >1:80 ICAP nomenclature: AC-0 For more information about Hep-2 cell patterns use ANApatterns.org, the official website for the International Consensus on Antinuclear Antibody (ANA) Patterns (ICAP). Performed At: [facility] [address] [place], [place] [id number] Jennette Shorter MD Ey:[id number]    Sed Rate 02/06/2024 4  0 - 22 mm/hr Final   Performed at [facility], [address] [place], [place] [address]   CRP 02/06/2024 <0.5  <1.0 mg/dL Final   Performed at [facility], [address], [place], [place] [address]   POC Amphetamine UR 02/06/2024 Positive (A)  NONE DETECTED (Cut Off Level 1000 ng/mL) Final   POC Secobarbital (BAR) 02/06/2024 None Detected  NONE DETECTED (Cut Off Level 300 ng/mL) Final   POC Buprenorphine (BUP) 02/06/2024 None Detected  NONE DETECTED (Cut Off Level 10 ng/mL) Final   POC Oxazepam (BZO) 02/06/2024 None Detected  NONE DETECTED (Cut Off Level 300 ng/mL) Final   POC Cocaine UR 02/06/2024 None Detected  NONE DETECTED (Cut Off Level 300 ng/mL) Final   POC Methamphetamine UR 02/06/2024 Positive (A)  NONE DETECTED (Cut Off Level 1000 ng/mL) Final   POC Morphine 02/06/2024 None Detected  NONE DETECTED (Cut Off Level 300 ng/mL) Final   POC Methadone UR 02/06/2024 None Detected  NONE DETECTED (Cut Off Level 300 ng/mL) Final   POC Oxycodone  UR 02/06/2024 None Detected  NONE DETECTED (Cut Off Level 100 ng/mL) Final   POC Marijuana UR 02/06/2024 Positive (A)  NONE DETECTED (Cut Off Level 50 ng/mL) Final  Admission on 02/06/2024, Discharged on 02/06/2024  Component Date Value Ref Range Status   WBC 02/06/2024 6.7  4.0 - 10.5 K/uL  Final   RBC 02/06/2024 4.34  3.87 - 5.11 MIL/uL  Final   Hemoglobin 02/06/2024 12.8  12.0 - 15.0 g/dL Final   HCT 87/89/7974 38.7  36.0 - 46.0 % Final   MCV 02/06/2024 89.2  80.0 - 100.0 fL Final   MCH 02/06/2024 29.5  26.0 - 34.0 pg Final   MCHC 02/06/2024 33.1  30.0 - 36.0 g/dL Final   RDW 87/89/7974 14.8  11.5 - 15.5 % Final   Platelets 02/06/2024 435 (H)  150 - 400 K/uL Final   nRBC 02/06/2024 0.0  0.0 - 0.2 % Final   Performed at [facility], [address] [place], [place] [address]   Sodium 02/06/2024 138  135 - 145 mmol/L Final   Potassium 02/06/2024 3.9  3.5 - 5.1 mmol/L Final   Chloride 02/06/2024 105  98 - 111 mmol/L Final   CO2 02/06/2024 24  22 - 32 mmol/L Final   Glucose, Bld 02/06/2024 92  70 - 99 mg/dL Final   Glucose reference range applies only to samples taken after fasting for at least 8 hours.   BUN 02/06/2024 12  6 - 20 mg/dL Final   Creatinine, Ser 02/06/2024 0.73  0.44 - 1.00 mg/dL Final   Calcium 87/89/7974 9.3  8.9 - 10.3 mg/dL Final   GFR, Estimated 02/06/2024 >60  >60 mL/min Final   Comment: (NOTE) Calculated using the CKD-EPI Creatinine Equation (2021)    Anion gap 02/06/2024 9  5 - 15 Final   Performed at [facility], [address] [place], [place] [address]   Total Protein 02/06/2024 7.1  6.5 - 8.1 g/dL Final   Albumin 87/89/7974 3.8  3.5 - 5.0 g/dL Final   AST 87/89/7974 22  15 - 41 U/L Final   ALT 02/06/2024 19  0 - 44 U/L Final   Alkaline Phosphatase 02/06/2024 42  38 - 126 U/L Final   Total Bilirubin 02/06/2024 0.4  0.0 - 1.2 mg/dL Final   Bilirubin, Direct 02/06/2024 <0.1  0.0 - 0.2 mg/dL Final   Indirect Bilirubin 02/06/2024 NOT CALCULATED  0.3 - 0.9 mg/dL Final   Performed at [facility], [address] [place], [place] [address]    Blood Alcohol level:  Lab Results  Component Value Date   [facility] <15 02/06/2024    Metabolic Disorder Labs: Lab Results  Component Value Date   HGBA1C 5.1 10/09/2021   MPG 99.67 10/09/2021   Lab Results  Component Value Date    PROLACTIN 8.4 02/06/2024   No results found for: CHOL, TRIG, HDL, CHOLHDL, VLDL, LDLCALC  Therapeutic Lab Levels: No results found for: LITHIUM No results found for: VALPROATE No results found for: CBMZ  Physical Findings   GAD-7    Flowsheet Row Routine [facility] Routine Prenatal from 10/13/2021 in [facility] for Women Routine Prenatal from 06/30/2021 in [facility] at [facility] for Women Initial Prenatal from 06/01/2021 in [facility] Video Visit from 05/18/2021 in [facility] at [facility]  Total GAD-7 Score 0 0 2 0 0   PHQ2-9    Flowsheet Row ED from 02/07/2024 in [facility] Routine Prenatal from 10/20/2021 in [facility] for Women Routine Prenatal from 10/13/2021 in [facility] Routine Prenatal from 06/30/2021 in [facility] [employer] [facility] for Women Initial Prenatal from 06/01/2021 in [facility] [employer] [facility]  PHQ-2 Total Score 2 0 2 0 0  PHQ-9 Total Score 10 3 4 2 1    Flowsheet Row ED from 02/07/2024 in [facility] Most recent reading at 02/07/2024 11:00 PM ED from 02/06/2024 in [facility] Most recent reading at 02/06/2024  2:10 PM ED from 02/06/2024 in [facility] Emergency Department at [facility] Most recent reading at 02/06/2024  6:55 AM  C-SSRS RISK CATEGORY Low Risk Low Risk No Risk     Musculoskeletal  Strength & Muscle Tone: within normal limits Gait & Station: normal Patient leans: N/A  Psychiatric Specialty Exam  Presentation  General Appearance:   Appropriate for Environment  Eye Contact: Fair  Speech: Clear and Coherent  Speech Volume: Normal  Handedness: Right   Mood and Affect  Mood: Anxious  Affect: Appropriate; Congruent   Thought Process  Thought Processes: Coherent  Descriptions of Associations:Intact  Orientation:Full (Time, Place and Person)  Thought Content:Logical  Diagnosis of Schizophrenia or Schizoaffective disorder in past: No  Duration of Psychotic Symptoms: Less than six months   Hallucinations:Hallucinations: None  Ideas of Reference:None  Suicidal Thoughts:Suicidal Thoughts: No  Homicidal Thoughts:Homicidal Thoughts: No   Sensorium  Memory: Immediate Good  Judgment: Fair  Insight: Fair   Executive Functions  Concentration: Good  Attention Span: Good  Recall: Fair  Fund of Knowledge: Fair  Language: Good   Psychomotor Activity  Psychomotor Activity: Psychomotor Activity: Normal   Assets  Assets: Communication Skills; Desire for Improvement   Sleep  Sleep: Sleep: Fair  Estimated Sleeping Duration (Last 24 Hours): 10.50-14.00 hours  No data recorded  Physical Exam  Physical Exam Vitals and nursing note reviewed.  HENT:     Head: Normocephalic.  Cardiovascular:     Rate and Rhythm: Normal rate.  Pulmonary:     Effort: Pulmonary effort is normal.  Musculoskeletal:        General: Normal range of motion.     Cervical back: Normal range of motion.  Neurological:     Mental Status: She is alert and oriented to person, place, and time.  Psychiatric:        Attention and Perception: Attention normal. She does not perceive auditory or visual hallucinations.        Mood and Affect: Affect normal. Mood is anxious.        Speech: Speech normal.        Behavior: Behavior normal. Behavior is cooperative.        Thought Content: Thought content normal. Thought content is not paranoid. Thought content does not include homicidal or suicidal  ideation. Thought content does not include homicidal or suicidal plan.        Cognition and Memory: Cognition normal.        Judgment: Judgment normal.    Review of Systems  Constitutional: Negative.   HENT: Negative.    Eyes: Negative.   Respiratory: Negative.    Cardiovascular: Negative.   Gastrointestinal: Negative.   Genitourinary: Negative.   Musculoskeletal: Negative.   Skin:  Positive for itching.  Neurological: Negative.   Endo/Heme/Allergies: Negative.   Psychiatric/Behavioral:  Positive for depression and substance abuse. Negative for hallucinations and suicidal ideas. The patient is nervous/anxious. The patient does not have insomnia.  Blood pressure 117/82, pulse 77, temperature 98.4 F (36.9 C), temperature source Oral, resp. rate 17, last menstrual period 01/28/2024, SpO2 100%. There is no height or weight on file to calculate BMI.  Treatment Plan Summary: Daily contact with patient to assess and evaluate symptoms and progress in treatment  Medication management  -Continue Tylenol  650 mg p.o. every 6 hours as needed mild pain -Continue agitation protocol as needed -Continue maalox 30 mL every 4 hours PRN for indigestion -Continue hydrocortisone  cream topically bid to forehead, dorsal surfaces of hands, and webbing spaces of fingers x 3 days -Continue hydroxyzine  25 mg p.o. 3 times a day as needed for anxiety or itching -Continue milk of magnesia 30 mL p.o. daily as needed for mild constipation -Continue nicotine  14 mg patch transdermally daily for smoking cessation  -Olanzapine  discontinued due to possible WPW (will continue to monitor for signs of psychosis, initial hallucinations upon admission were in the context of substance induced psychosis with the use of meth and MDMA)  -Discontinued trazodone  50 mg p.o. nightly as needed for sleep due to Wolff-Parkinson White syndrome which is associated with an increased risk of arrhythmias. Trazodone  is known to have  potential effects on cardiac conduction, including QT prolongation and risk of precipitating arrhythmias especially in individuals with pre-existing conduction abnormalities like WPW.  -Start melatonin 3 mg by mouth at bedtime PRN for sleep  Plan Tentative discharge date of 12/17. Care coordination to assist with discharge planning. Will need referral to cardiology for follow-up for EKGs showing WPW.  Novalee Horsfall, NP 02/10/2024 4:32 PM

## 2024-02-10 NOTE — ED Notes (Signed)
 Pt reports to feel 'fine'. Pt denies si hi avh, verbal contract for safety provided. Medication reviewed, question denied. Pt report personal tendency of getting agitated at times, r/t RN education about zyprexa . Pt ate breakfast. Pt unsure of last BM, pt declined medication intervention, says / affirms she is passing gas.

## 2024-02-10 NOTE — Group Note (Signed)
 Group Topic: Recovery Basics  Group Date: 02/10/2024 Start Time: 1300 End Time: 1345 Facilitators: Alyse Leilani LABOR, NT  Department: [facility]  Number of Participants: 8  Group Focus: relapse prevention, self-awareness, self-esteem, and social skills Treatment Modality:  Patient-Centered Therapy Interventions utilized were clarification, patient education, and problem solving Purpose: enhance coping skills, express feelings, increase insight, regain self-worth, and reinforce self-care  Name: Madison Soto Date of Birth: 03/09/1995  MR: [id number]   Pt did not attend group.  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

## 2024-02-11 DIAGNOSIS — F19959 Other psychoactive substance use, unspecified with psychoactive substance-induced psychotic disorder, unspecified: Secondary | ICD-10-CM | POA: Diagnosis not present

## 2024-02-11 DIAGNOSIS — R45851 Suicidal ideations: Secondary | ICD-10-CM | POA: Diagnosis not present

## 2024-02-11 DIAGNOSIS — R442 Other hallucinations: Secondary | ICD-10-CM | POA: Diagnosis not present

## 2024-02-11 DIAGNOSIS — F1520 Other stimulant dependence, uncomplicated: Secondary | ICD-10-CM | POA: Diagnosis not present

## 2024-02-11 MED ORDER — WHITE PETROLATUM EX OINT
TOPICAL_OINTMENT | Freq: Three times a day (TID) | CUTANEOUS | Status: DC | PRN
  Administered 2024-02-11 – 2024-02-12 (×2): 1 via TOPICAL
  Filled 2024-02-11: qty 5

## 2024-02-11 NOTE — Group Note (Signed)
 Group Topic: Communication  Group Date: 02/11/2024 Start Time: 2000 End Time: 2030 Facilitators: Verdon Jacqualyn BRAVO, NT  Department: [facility]  Number of Participants: 9  Group Focus: communication Treatment Modality:  Individual Therapy Interventions utilized were group exercise Purpose: improve communication skills  Name: ADELL KOVAL Date of Birth: Jun 24, 1995  MR: [id number]    Level of Participation: active Quality of Participation: cooperative Interactions with others: gave feedback Mood/Affect: appropriate Triggers (if applicable): n/a Cognition: coherent/clear Progress: Moderate Response: n/a Plan: follow-up needed  Patients Problems:  Patient Active Problem List   Diagnosis Date Noted   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

## 2024-02-11 NOTE — Care Management (Addendum)
 [facility] Care Management...  Addendum 3:14 pm  Writer provided patient with the numbers for [facility] and [facility] to complete phone screenings.   Patient under review at [facility]  Per Rosina at [facility], unable to verify patients insurance   Addendum 10:00 am  Collateral ...  Writer spoke with patients aunt,Sheri and her husband  They feel that patient needs to stay and get the help she needs, even inpatient treatment. If that is recommended. They reported that patient has been through a lot over the past few months. They reported that if patient was to discharge she would come to there home. They reiterated, that she needs to get the help.   Writer met with patient to discuss discharge planning  Patient reported that she wants to discharge. Patient reported that she can go to aunts, Tylene [phone]  Writer reached out to patients Aunt, no answer. Writer left HIPAA compliant message for return call.

## 2024-02-11 NOTE — ED Notes (Signed)
 Pt sleeping in bed, no acute distress noted. Respirations even and unlabored. Continue to monitor for safety.

## 2024-02-11 NOTE — Group Note (Signed)
 Group Topic: Change and Accountability  Group Date: 02/11/2024 Start Time: 1100 End Time: 1200 Facilitators: Maylyn Narvaiz, LCSW  Department: [facility]  Number of Participants: 7  Group Focus: chemical dependency education, communication, coping skills, forgiveness, relapse prevention, self-awareness, and substance abuse education Treatment Modality:  Cognitive Behavioral Therapy, Psychoeducation, and Solution-Focused Therapy Interventions utilized were confrontation, exploration, problem solving, and support Purpose: enhance coping skills, explore maladaptive thinking, express feelings, express irrational fears, regain self-worth, reinforce self-care, and trigger / craving management  Writer explored with the group triggers related to coping with substances  and accountability.  Writer utilized Art therapy and CBT to explore triggers, forgiveness, and accountability.     Group members provided positive supportive feedback and expressed motivation for change.  Writer challenged each group members accountability of what lead them to this detox facility and if they want to change, what does that look like.   Name: TERINA MCELHINNY Date of Birth: 10/16/1995  MR: [id number]    Level of Participation: moderate Quality of Participation: cooperative, offered feedback, and side talking Interactions with others: gave feedback Mood/Affect: appropriate, brightens with interaction, and closed / guarded Triggers (if applicable): Client reports her children and negative thoughts. Cognition: goal directed Progress: Moderate Response:  Client reports she wants inpatient treatment and she's ready to make a change.  Plan: follow-up as needed  Patients Problems:  Patient Active Problem List   Diagnosis Date Noted   Psychoactive substance-induced psychosis (HCC) 02/07/2024   Substance-induced psychotic disorder with hallucinations (HCC) 02/06/2024   Psychotic disorder  due to dissociative drug (HCC) 02/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

## 2024-02-11 NOTE — Care Management (Signed)
 [facility] Care Management...  Writer called [employer] to check on the status of her application. Client completed her phone assessment today.  [facility] reports they need a letter stating the client is stable and presenting with no psychosis.    Writer notified Provider and they will complete a letter to have faxed over by tomorrow on her behalf at the latest.

## 2024-02-11 NOTE — Group Note (Signed)
 Group Topic: Positive Affirmations  Group Date: 02/11/2024 Start Time: 1330 End Time: 1400 Facilitators: Laneta Renea POUR, NT  Department: [facility]  Number of Participants: 5  Group Focus: check in, communication, and coping skills Treatment Modality:  Psychoeducation Interventions utilized were patient education Purpose: enhance coping skills  Name: Madison Soto Date of Birth: [birth year]  MR: [id number]    Level of Participation: active Quality of Participation: cooperative and engaged Interactions with others: gave feedback Mood/Affect: appropriate and positive Triggers (if applicable): none Cognition: coherent/clear and insightful Progress: Gaining insight Response: I have been through a lot recently.  Plan: patient will be encouraged to attend group.   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

## 2024-02-11 NOTE — ED Notes (Addendum)
 Pt received in room calm, and cooperative, bright affect, denies discomfort maintained interacting appropriately with staff.  Safety maintained.

## 2024-02-11 NOTE — Group Note (Signed)
 Group Topic: Recovery Basics  Group Date: 02/11/2024 Start Time: 0930 End Time: 1000 Facilitators: Belle Camellia SAILOR, RN  Department: [facility]  Number of Participants: 8 Group Focus: acceptance and relapse prevention Treatment Modality:  Solution-Focused Therapy Interventions utilized were patient education Purpose: increase insight and relapse prevention strategies  Name: Madison Soto Date of Birth: 02/18/1996  MR: [id number]    Level of Participation: moderate Quality of Participation: attentive and supportive Interactions with others: gave feedback Mood/Affect: appropriate  Cognition: logical Progress: Moderate Plan  follow-up needed  Patients Problems:  Patient Active Problem List   Diagnosis Date Noted   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

## 2024-02-11 NOTE — Progress Notes (Signed)
 Pt is awake and alert. Sitting up in the dayroom eating breakfast.  She is maintaining appropriate boundaries with staff and peers.  No distress noted.

## 2024-02-11 NOTE — ED Provider Notes (Cosign Needed Addendum)
 Behavioral Health Progress Note  Date and Time: 02/11/2024 1:15 PM Name: Madison Soto MRN:  [id number]  HPI: Madison Soto is a 28 year old female who presented voluntarily to [facility] behavioral health urgent care on 02/06/2024.  Upon arrival patient endorsed worsening depression, obsessive symptoms, paranoia and delusional thoughts.  Patient endorsed MDMA dependence.  Using MDMA x 3 months.    Chart reviewed and patient discussed with attending psychiatrist, Dr. Cole.  Patient assessment, 02/11/2024:  Patient is assessed by this nurse practitioner face-to-face.  She is alert and oriented, pleasant and cooperative during assessment.  Patient is seated, no apparent distress.  She is alert and oriented, pleasant and cooperative during assessment.  Madison Soto denies physical complaints.  Previous itching skin has subsided.  Reports dry skin to bilateral hands.  On assessment today, the pt reports that their mood is continuing to show improvement. Reports that anxiety is improving, but was triggered and heightened today due to a conversation that she had with her aunt. Madison Soto would like to discharge to her aunt's home. Her aunt would prefer she receive residential substance use treatment prior to returning home.  Patient is insightful today states I am know that I should and I will just stay here but that is what is best for me.  Madison Soto is positive and present for groups.  She is attending meals in common area.  Behavior with staff and peers appropriate.  She endorses average sleep and appetite.    Concentration is fair. Energy level is average today. She is continuing to deny suicidal thoughts.  Denies suicidal intent and plan.  Denies having any HI.  Denies having psychotic symptoms.  Patient denies auditory or visual hallucinations, denies paranoia or delusional thinking.  No evidence of delusional thought content no indication that patient is responding to internal  stimuli. Denies symptoms of withdrawal. Denies having side effects to current medications.    We discussed continuing medications as ordered with some adjustments as follows: Reports dry skin to bilateral posterior hands.   Start: White petrolatum  Vaseline gel topical 3 times daily as needed/dry skin to bilateral hands.  Patient offered support and encouragement.  Reviewed treatment plan to include discharge to residential substance use treatment once placement available.  Patient verbalizes understanding plan.              Diagnosis:  Final diagnoses:  Substance-induced psychotic disorder (HCC)  Tactile hallucination  WPW (Wolff-Parkinson-White syndrome)  Methamphetamine dependence (HCC)  History of methylenedioxymethamphetamine (MDMA) use    Total Time spent with patient: 30 minutes  Family History: Medical: paternal aunt has breast cancer,paternal grandfather and grandmother have HTN, paternal grandmother has diabetes. Strong family history of systemic lupus erythematosus (SLE). Family Psychiatric  History: Per patient's aunt: patient's father is reported to have had bipolar disorder, mother reportedly has schizophrenia/bipolar disorder Social History: [place] , raised in [place]  has lived here ever since. Patient was unable to complete high school, went and lived with a boyfriend got pregnant and never finished school. Has a 91 year old son that lives with her aunt and then a 70 and 66 year old that live with her father. 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 Has been living in hotels.  Additional Social History:            Sleep: Good  Appetite:  Good  Current Medications:  Current Facility-Administered Medications  Medication Dose Route Frequency Provider Last Rate Last Admin   acetaminophen  (  TYLENOL ) tablet 650 mg  650 mg Oral Q6H PRN Delsie Lynwood Morene Lavone, MD   650 mg at 02/10/24 2102   alum  & mag hydroxide-simeth (MAALOX/MYLANTA) 200-200-20 MG/5ML suspension 30 mL  30 mL Oral Q4H PRN Delsie Lynwood Morene Lavone, MD       haloperidol  (HALDOL ) tablet 5 mg  5 mg Oral TID PRN Delsie Lynwood Morene Lavone, MD       And   diphenhydrAMINE  (BENADRYL ) capsule 50 mg  50 mg Oral TID PRN Delsie Lynwood Morene Lavone, MD       haloperidol  lactate (HALDOL ) injection 5 mg  5 mg Intramuscular TID PRN Delsie Lynwood Morene Lavone, MD       And   diphenhydrAMINE  (BENADRYL ) injection 50 mg  50 mg Intramuscular TID PRN Delsie Lynwood Morene Lavone, MD       And   LORazepam  (ATIVAN ) injection 2 mg  2 mg Intramuscular TID PRN Delsie Lynwood Morene Lavone, MD       haloperidol  lactate (HALDOL ) injection 10 mg  10 mg Intramuscular TID PRN Delsie Lynwood Morene Lavone, MD       And   diphenhydrAMINE  (BENADRYL ) injection 50 mg  50 mg Intramuscular TID PRN Delsie Lynwood Morene Lavone, MD       And   LORazepam  (ATIVAN ) injection 2 mg  2 mg Intramuscular TID PRN Delsie Lynwood Morene Lavone, MD       hydrocortisone  cream 1 %   Topical BID Hobson, Fran E, NP   1 Application at 02/11/24 9049   hydrOXYzine  (ATARAX ) tablet 25 mg  25 mg Oral TID Hobson, Fran E, NP   25 mg at 02/11/24 0920   magnesium  hydroxide (MILK OF MAGNESIA) suspension 30 mL  30 mL Oral Daily PRN Delsie Lynwood Morene Lavone, MD       melatonin tablet 3 mg  3 mg Oral QHS PRN Ramzan, Mariam, NP   3 mg at 02/10/24 2100   nicotine  (NICODERM CQ  - dosed in mg/24 hours) patch 14 mg  14 mg Transdermal Q0600 Delsie Lynwood Morene Lavone, MD   14 mg at 02/11/24 9078   white petrolatum  (VASELINE) gel   Topical TID PRN Garmon Dehn L, FNP       Current Outpatient Medications  Medication Sig Dispense Refill   bacitracin  ointment Apply 1 Application topically 2 (two) times daily. (Patient not taking: Reported on 02/06/2024) 120 g 0   Chlorhexidine  Gluconate 2 % SOLN Apply 1 Application topically daily for 7 days.  (Patient not taking: Reported on 02/06/2024) 473 mL 0   hydrOXYzine  (ATARAX ) 25 MG tablet Take 1 tablet (25 mg total) by mouth every 6 (six) hours. 12 tablet 0   permethrin  (ELIMITE ) 5 % cream Apply to affected area once (Patient taking differently: Apply 1 Application topically once. Apply to affected area once) 60 g 0   triamcinolone  cream (KENALOG ) 0.1 % Apply 1 Application topically 2 (two) times daily. 30 g 0    Labs  Lab Results:  Admission on 02/07/2024  Component Date Value Ref Range Status   HIV Screen 4th Generation wRfx 02/09/2024 Non Reactive  Non Reactive Final   Performed at [facility], [address] [place], [place] [address]   Preg Test, Ur 02/08/2024 Negative  Negative Final  Admission on 02/06/2024, Discharged on 02/07/2024  Component Date Value Ref Range Status   Alcohol, Ethyl (B) 02/06/2024 <15  <15 mg/dL Final   Comment: (NOTE) For medical purposes only. Performed at [facility], [address], [place], [place] [address]    TSH 02/06/2024 1.166  0.350 - 4.500 uIU/mL Final   Comment: Performed by a 3rd Generation assay with a functional sensitivity of <=0.01 uIU/mL. Performed at [facility], [address] [place], [place] [address]    Prolactin 02/06/2024 8.4  4.8 - 33.4 ng/mL Final   Comment: (NOTE) Performed At: [facility] [address] [place], [place] [id number] Jennette Shorter MD Ey:[id number]    Neisseria Gonorrhea 02/06/2024 Negative   Final   Chlamydia 02/06/2024 Negative   Final   Comment 02/06/2024 Normal Reference Ranger Chlamydia - Negative   Final   Comment 02/06/2024 Normal Reference Range Neisseria Gonorrhea - Negative   Final   RPR Ser Ql 02/06/2024 NON REACTIVE  NON REACTIVE Final   Performed at [facility], [address] [place], [place] [address]   ANA Ab, IFA 02/06/2024 Negative   Final   Comment: (NOTE)                                     Negative   <1:80                                      Borderline  1:80                                     Positive   >1:80 ICAP nomenclature: AC-0 For more information about Hep-2 cell patterns use ANApatterns.org, the official website for the International Consensus on Antinuclear Antibody (ANA) Patterns (ICAP). Performed At: [facility] [address] [place], [place] [id number] Jennette Shorter MD Ey:[id number]    Sed Rate 02/06/2024 4  0 - 22 mm/hr Final   Performed at [facility], [address], [place], [place] [address]   CRP 02/06/2024 <0.5  <1.0 mg/dL Final   Performed at [facility], [address], [place], [place] [address]   POC Amphetamine UR 02/06/2024 Positive (A)  NONE DETECTED (Cut Off Level 1000 ng/mL) Final   POC Secobarbital (BAR) 02/06/2024 None Detected  NONE DETECTED (Cut Off Level 300 ng/mL) Final   POC Buprenorphine (BUP) 02/06/2024 None Detected  NONE DETECTED (Cut Off Level 10 ng/mL) Final   POC Oxazepam (BZO) 02/06/2024 None Detected  NONE DETECTED (Cut Off Level 300 ng/mL) Final   POC Cocaine UR 02/06/2024 None Detected  NONE DETECTED (Cut Off Level 300 ng/mL) Final   POC Methamphetamine UR 02/06/2024 Positive (A)  NONE DETECTED (Cut Off Level 1000 ng/mL) Final   POC Morphine 02/06/2024 None Detected  NONE DETECTED (Cut Off Level 300 ng/mL) Final   POC Methadone UR 02/06/2024 None Detected  NONE DETECTED (Cut Off Level 300 ng/mL) Final   POC Oxycodone  UR 02/06/2024 None Detected  NONE DETECTED (Cut Off Level 100 ng/mL) Final   POC Marijuana UR 02/06/2024 Positive (A)  NONE DETECTED (Cut Off Level 50 ng/mL) Final  Admission on 02/06/2024, Discharged on 02/06/2024  Component Date Value Ref Range Status   WBC 02/06/2024 6.7  4.0 - 10.5 K/uL Final   RBC 02/06/2024 4.34  3.87 - 5.11 MIL/uL Final   Hemoglobin 02/06/2024 12.8  12.0 - 15.0 g/dL Final   HCT 87/89/7974  38.7  36.0 - 46.0 % Final   MCV 02/06/2024 89.2  80.0 - 100.0 fL Final   MCH 02/06/2024 29.5  26.0 - 34.0 pg Final   MCHC  02/06/2024 33.1  30.0 - 36.0 g/dL Final   RDW 87/89/7974 14.8  11.5 - 15.5 % Final   Platelets 02/06/2024 435 (H)  150 - 400 K/uL Final   nRBC 02/06/2024 0.0  0.0 - 0.2 % Final   Performed at [facility], [address], [place], [place] [address]   Sodium 02/06/2024 138  135 - 145 mmol/L Final   Potassium 02/06/2024 3.9  3.5 - 5.1 mmol/L Final   Chloride 02/06/2024 105  98 - 111 mmol/L Final   CO2 02/06/2024 24  22 - 32 mmol/L Final   Glucose, Bld 02/06/2024 92  70 - 99 mg/dL Final   Glucose reference range applies only to samples taken after fasting for at least 8 hours.   BUN 02/06/2024 12  6 - 20 mg/dL Final   Creatinine, Ser 02/06/2024 0.73  0.44 - 1.00 mg/dL Final   Calcium 87/89/7974 9.3  8.9 - 10.3 mg/dL Final   GFR, Estimated 02/06/2024 >60  >60 mL/min Final   Comment: (NOTE) Calculated using the CKD-EPI Creatinine Equation (2021)    Anion gap 02/06/2024 9  5 - 15 Final   Performed at [facility], [address], [place], [place] [address]   Total Protein 02/06/2024 7.1  6.5 - 8.1 g/dL Final   Albumin 87/89/7974 3.8  3.5 - 5.0 g/dL Final   AST 87/89/7974 22  15 - 41 U/L Final   ALT 02/06/2024 19  0 - 44 U/L Final   Alkaline Phosphatase 02/06/2024 42  38 - 126 U/L Final   Total Bilirubin 02/06/2024 0.4  0.0 - 1.2 mg/dL Final   Bilirubin, Direct 02/06/2024 <0.1  0.0 - 0.2 mg/dL Final   Indirect Bilirubin 02/06/2024 NOT CALCULATED  0.3 - 0.9 mg/dL Final   Performed at [facility], [address] [place], [place] [address]    Blood Alcohol level:  Lab Results  Component Value Date   [facility] <15 02/06/2024    Metabolic Disorder Labs: Lab Results  Component Value Date   HGBA1C 5.1 10/09/2021   MPG 99.67 10/09/2021   Lab Results  Component Value Date   PROLACTIN 8.4 02/06/2024   No results found for: CHOL, TRIG, HDL, CHOLHDL, VLDL, LDLCALC  Therapeutic Lab Levels: No results found for: LITHIUM No results found for: VALPROATE No  results found for: CBMZ  Physical Findings   GAD-7    Flowsheet Row Routine Prenatal from [facility]/20[facility] Routine Prenatal from 10/13/2021 in [facility] Routine Prenatal from 06/30/2021 in [facility] Initial Prenatal from 06/01/2021 in [facility] Video Visit from 05/18/2021 in [facility]  Total GAD-7 Score 0 0 2 0 0   PHQ2-9    Flowsheet Row ED from 02/07/2024 in [facility] Routine Prenatal from 10/20/2021 in [facility] Routine Prenatal from 10/13/2021 in [facility] at [facility] Routine Prenatal from 06/30/2021 in [facility] at [facility] for [facility] Initial Prenatal from 06/01/2021  in [facility] [employer] [facility] for Women  PHQ-2 Total Score 2 0 2 0 0  PHQ-9 Total Score 10 3 4 2 1    Flowsheet Row ED from 02/07/2024 in [facility] Most recent reading at 02/07/2024 11:00 PM ED from 02/06/2024 in [facility] Most recent reading at 02/06/2024  2:10 PM ED from 02/06/2024 in [facility] Department at [facility] Most recent reading at 02/06/2024  6:55 AM  C-SSRS RISK CATEGORY Low Risk Low Risk No Risk     Musculoskeletal  Strength & Muscle Tone: within normal limits Gait & Station: normal Patient leans: N/A  Psychiatric Specialty Exam  Presentation  General Appearance:  Appropriate for Environment; Casual  Eye Contact: Good  Speech: Clear and Coherent; Normal Rate  Speech Volume: Normal  Handedness: Right   Mood and Affect   Mood: Euthymic  Affect: Appropriate; Congruent   Thought Process  Thought Processes: Coherent; Goal Directed; Linear  Descriptions of Associations:Intact  Orientation:Full (Time, Place and Person)  Thought Content:Logical; WDL  Diagnosis of Schizophrenia or Schizoaffective disorder in past: No    Hallucinations:Hallucinations: None  Ideas of Reference:None  Suicidal Thoughts:Suicidal Thoughts: No  Homicidal Thoughts:Homicidal Thoughts: No   Sensorium  Memory: Immediate Good; Recent Fair  Judgment: Fair  Insight: Fair   Art Therapist  Concentration: Good  Attention Span: Good  Recall: Good  Fund of Knowledge: Fair  Language: Fair   Psychomotor Activity  Psychomotor Activity: Psychomotor Activity: Normal   Assets  Assets: Communication Skills; Desire for Improvement; Financial Resources/Insurance; Social Support; Resilience; Physical Health   Sleep  Sleep: Sleep: Good  Estimated Sleeping Duration (Last 24 Hours): 10.75-11.50 hours  No data recorded  Physical Exam  Physical Exam Vitals and nursing note reviewed.  Constitutional:      Appearance: Normal appearance. She is well-developed.  HENT:     Head: Normocephalic and atraumatic.     Nose: Nose normal.  Cardiovascular:     Rate and Rhythm: Normal rate.  Pulmonary:     Effort: Pulmonary effort is normal.  Musculoskeletal:        General: Normal range of motion.     Cervical back: Normal range of motion.  Skin:    General: Skin is warm and dry.     Comments: Dry-appearing skin to bilateral posterior hands  Neurological:     Mental Status: She is alert and oriented to person, place, and time.  Psychiatric:        Attention and Perception: Attention and perception normal.        Mood and Affect: Affect normal. Mood is anxious.        Speech: Speech normal.        Behavior: Behavior normal. Behavior is cooperative.        Thought Content: Thought content normal.         Cognition and Memory: Cognition and memory normal.        Judgment: Judgment normal.    Review of Systems  Constitutional: Negative.   HENT: Negative.    Eyes: Negative.   Respiratory: Negative.    Cardiovascular: Negative.   Gastrointestinal: Negative.   Genitourinary: Negative.   Musculoskeletal: Negative.   Skin: Negative.   Neurological: Negative.   Psychiatric/Behavioral:  Positive for substance abuse. The patient is nervous/anxious.    Blood pressure 103/73, pulse 82, temperature 98.6 F (37 C), resp. rate 17, last menstrual period 01/28/2024, SpO2 100%. There is  no height or weight on file to calculate BMI.  Treatment Plan Summary: Daily contact with patient to assess and evaluate symptoms and progress in treatment  EKG 02/11/2024: Normal sinus rhythm with sinus arrhythmia, ventricular pre-excitation, or WPW pattern type B.  QT/QTcB 374/426 MS.  Medication management:  Start: White petrolatum  Vaseline gel topical 3 times daily as needed/dry skin to bilateral hands.   Continue: -Continue Tylenol  650 mg p.o. every 6 hours as needed mild pain -Continue agitation protocol as needed -Continue maalox 30 mL every 4 hours PRN for indigestion -Continue hydrocortisone  cream topically bid to forehead, dorsal surfaces of hands, and webbing spaces of fingers x 3 days -Continue hydroxyzine  25 mg p.o. 3 times a day as needed for anxiety or itching -Continue melatonin 3 mg by mouth at bedtime PRN for sleep -Continue milk of magnesia 30 mL p.o. daily as needed for mild constipation -Continue nicotine  14 mg patch transdermally daily for smoking cessation   Suspect WPW, Olanzapine  and trazodone  discontinued due to potential Wolff-Parkinson White syndrome which is associated with an increased risk of arrhythmias. Continued close monitoring for evidence of psychosis, initial hallucinations upon admission were in the context of substance induced psychosis with the use of meth and MDMA.   Education provided surrounding need to abstain from illicit substance use particularly stimulant use concern for accelerated heart rate, vasoconstriction, narrowing of vessels or cardiac damage.  Patient verbalizes understanding.   Plan Tentative discharge date of 12/17. Care coordination to assist with discharge planning. Will need referral to PCP and cardiology for follow-up for EKGs showing WPW.  02/11/2024 patient stable today for discharge to residential substance use treatment.  Ellouise LITTIE Dawn, FNP 02/11/2024 1:15 PM

## 2024-02-11 NOTE — Care Management (Signed)
 [facility] Care Management..  Writer called Marien Louder at [employer] to ask about assistance with Medicaid application.  Writer left a HIPAA compliant VM requesting a returned phone call.    Marien Louder #: [phone]

## 2024-02-12 DIAGNOSIS — F1520 Other stimulant dependence, uncomplicated: Secondary | ICD-10-CM | POA: Diagnosis not present

## 2024-02-12 DIAGNOSIS — F19959 Other psychoactive substance use, unspecified with psychoactive substance-induced psychotic disorder, unspecified: Secondary | ICD-10-CM | POA: Diagnosis not present

## 2024-02-12 DIAGNOSIS — R45851 Suicidal ideations: Secondary | ICD-10-CM | POA: Diagnosis not present

## 2024-02-12 DIAGNOSIS — R442 Other hallucinations: Secondary | ICD-10-CM | POA: Diagnosis not present

## 2024-02-12 MED ORDER — IBUPROFEN 200 MG PO TABS
200.0000 mg | ORAL_TABLET | Freq: Four times a day (QID) | ORAL | Status: DC | PRN
  Administered 2024-02-12 (×2): 200 mg via ORAL
  Filled 2024-02-12 (×3): qty 1

## 2024-02-12 MED ORDER — BENZOCAINE 10 % MT GEL
1.0000 | Freq: Three times a day (TID) | OROMUCOSAL | Status: DC | PRN
  Administered 2024-02-12 (×2): 1 via OROMUCOSAL
  Filled 2024-02-12: qty 9

## 2024-02-12 NOTE — Group Note (Deleted)
 Group Topic: Relapse and Recovery  Group Date: 02/12/2024 Start Time: 1300 End Time: 1330 Facilitators: Clodfelter-Simmons, Valbona Slabach L, NT  Department: [facility]  Number of Participants: 7  Group Focus: impulsivity Treatment Modality:  Psychoeducation Interventions utilized were problem solving Purpose: relapse prevention strategies  Name: MARSHELLE BILGER Date of Birth: [birth year]  MR: [id number]    Level of Participation: {THERAPIES; PSYCH GROUP PARTICIPATION OZCZO:76008} Quality of Participation: {THERAPIES; PSYCH QUALITY OF PARTICIPATION:23992} Interactions with others: {THERAPIES; PSYCH INTERACTIONS:23993} Mood/Affect: {THERAPIES; PSYCH MOOD/AFFECT:23994} Triggers (if applicable): *** Cognition: {THERAPIES; PSYCH COGNITION:23995} Progress: {THERAPIES; PSYCH PROGRESS:23997} Response: *** Plan: {THERAPIES; PSYCH EOJW:76003}  Patients Problems:  Patient Active Problem List   Diagnosis Date Noted   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

## 2024-02-12 NOTE — ED Notes (Signed)
 Patient A&Ox4. Denies intent to harm self/others when asked. Denies A/VH. Patient denies any physical complaints when asked. No acute distress noted. Pleasant during assessment with clinical research associate. Routine safety checks conducted according to facility protocol. Encouraged patient to notify staff if thoughts of harm toward self or others arise. Patient verbalize understanding and agreement. Will continue to monitor for safety.

## 2024-02-12 NOTE — Care Management (Signed)
 [facility] Care Management...  Per previous note, [facility] needed provider note indicating patient stable and presenting no psychosis.  Writer received updated documentation and faxed to [facility]

## 2024-02-12 NOTE — Group Note (Signed)
 Group Topic: Understanding Self  Group Date: 02/12/2024 Start Time: 1200 End Time: 1230 Facilitators: Daved [name] HERO, RN  Department: [facility]  Number of Participants: 7  Group Focus: psychiatric education Treatment Modality:  Psychoeducation Interventions utilized were exploration Purpose: increase insight  Name: Madison Soto Date of Birth: [birth year]  MR: [id number]    Level of Participation: moderate Quality of Participation: attentive and cooperative Interactions with others: gave feedback Mood/Affect: appropriate Triggers (if applicable): n/a Cognition: coherent/clear Progress: Gaining insight Response: pt says they would like their future self to be a better parent Plan: patient will be encouraged to attend future RN education groups   Patients Problems:  Patient Active Problem List   Diagnosis Date Noted   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

## 2024-02-12 NOTE — Group Note (Deleted)
 Group Topic: Relapse and Recovery  Group Date: 02/12/2024 Start Time: 1300 End Time: 1330 Facilitators: Clodfelter-Simmons, Narvel Kozub L, NT  Department: [facility]  Number of Participants: 7  Group Focus: relapse prevention Treatment Modality:  Psychoeducation Interventions utilized were patient education and problem solving Purpose: relapse prevention strategies   Name: Madison Soto Date of Birth: 10/18/1995  MR: [id number]    Level of Participation: {THERAPIES; PSYCH GROUP PARTICIPATION OZCZO:76008} Quality of Participation: {THERAPIES; PSYCH QUALITY OF PARTICIPATION:23992} Interactions with others: {THERAPIES; PSYCH INTERACTIONS:23993} Mood/Affect: {THERAPIES; PSYCH MOOD/AFFECT:23994} Triggers (if applicable): *** Cognition: {THERAPIES; PSYCH COGNITION:23995} Progress: {THERAPIES; PSYCH PROGRESS:23997} Response: *** Plan: {THERAPIES; PSYCH EOJW:76003}  Patients Problems:  Patient Active Problem List   Diagnosis Date Noted   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

## 2024-02-12 NOTE — Discharge Instructions (Signed)
 [facility] CARE MANAGEMENT:  Patient requesting discharge  Patient will discharge today 02/13/2024 to [name] home ...  [address]  [place]  [address]   Madison Soto will pick patient up  7 day samples 30 day scripts  Per Joen @ [email or web address]  Patient has been accepted to their program. Patient scheduled for intake 02/14/2024 @ 9:30 am    [facility] [address]  [place], [place] [address] Phone: [phone]   7 day sample and  30 day scripts   Out Patient Therapist  Base on the information you have provided and the presenting issue, outpatient services with therapy and psychiatry have been recommended.  It is imperative that you follow through with treatment recommendations within 5-7 days from the of discharge to mitigate further risk to your safety and mental well-being. A list of referrals has been provided below to get you started.  You are not limited to the list provided.  In case of an urgent crisis, you may contact the Mobile Crisis Unit with [facility] at [phone].   [facility] [address] [place], [place], [address] [phone] phone  [facility] [address] [place], [place], [address] [address],5TH FLOOR phone ([address] [employer] [address] - [place], [address], [place], [place], [employer], [address], BCBS Healthy [place], [place], [address], [place], [place], [place], Optum, Tricare, UHC, [employer], [place])  [facility] [address] [place], [place], [address] [phone] phone  [facility] [address], [address] [place], [place], [address] [phone] phone  [facility] [address] [place], [place], [address] [phone] phone [phone] fax  [facility], [place] [address], [place], [address] [phone] phone  [facility] [address] [place], [place], [address] [phone] phone [phone] fax  [facility] [address] [place], [place], [address] [phone] phone  Janit Griffins [address] [place], [place], [address] [phone] phone  [facility] [address] [place], [place], [address] [phone] phone [phone] fax PRIMARY CARE PROVIDERS:  ACCEPTS INDIVIDUALS WITH NO INSURANCE and MEDICAID  [facility] Address: [address] [place] [address], [place], [place] [address] Phone: [phone]  [facility], [place] [address], [place], [place] [address] [phone]  [facility] Address: [address] [place], [place], [place] [address] Areas served: [place] Phone: [phone]  [facility] Department - Primary [facility] Clinic Address: [address] [place], [place], [place] [address] Phone: [phone]

## 2024-02-12 NOTE — ED Notes (Signed)
 Verbal altercation with staff. Requiring verbal de-escalation.  Refusing prn requesting to go to room.  Needs anticipated.

## 2024-02-12 NOTE — Group Note (Signed)
 Group Topic: Healthy Self Image and Positive Change  Group Date: 02/12/2024 Start Time: 2030 End Time: 2100 Facilitators: Anice Benton LABOR, NT  Department: [facility]  Number of Participants: 4  Group Focus: clarity of thought, discharge education, and feeling awareness/expression Treatment Modality:  Patient-Centered Therapy Interventions utilized were group exercise and support Purpose: explore maladaptive thinking and express feelings  Name: KEENYA MATERA Date of Birth: 1995-02-28  MR: [id number]    Level of Participation: Did Not Attend  Quality of Participation: N/A Interactions with others: N/A Mood/Affect: N/A Triggers (if applicable): N/A Cognition: N/A Progress: None Response: N/A Plan: patient will be encouraged to attend groups   Patients Problems:  Patient Active Problem List   Diagnosis Date Noted   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

## 2024-02-12 NOTE — ED Notes (Signed)
 Pt up holding face complaining of toothache pain. NP notified ordered and adninistere orajel and motrin  200mg  . Pain score 6 Will maintain safety.

## 2024-02-12 NOTE — ED Provider Notes (Cosign Needed)
 Behavioral Health Progress Note  Date and Time: 02/12/2024 10:50 AM Name: Madison Soto MRN:  [id number]  HPI: Madison Soto is a 28 year old female who presented voluntarily to [facility] urgent care on 02/06/2024.  Upon arrival patient endorsed worsening depression, obsessive symptoms, paranoia and delusional thoughts.  Patient endorsed MDMA dependence.  Using MDMA x 3 months.  Chart reviewed and patient discussed with attending psychiatrist, Dr. Cole.  Patient assessment, 02/12/2024: [name] is reassessed by this nurse practitioner face-to-face.  Upon approach she is seated in common area, actively engaged with peers.  Patient denies physical complaint, denies pain. Well and is alert and oriented, pleasant and cooperative during assessment.  She presents with euthymic mood, bright affect. Patient reports improved mood.  She endorses improved energy level.  Tylia is positive for groups on unit.  She endorses average sleep and appetite.  Behavior appropriate with staff and peers.  Maribell continues to deny SI/HI/AVH.  There is no evidence of delusional thought content no indication that patient is responding to internal stimuli.  Previously treated rule out scabies and dry skin.  Today Madison Soto denies skin complaints, itching has resolved.  She denies withdrawal symptoms.  She is compliant with medications.  Received ibuprofen  and benzocaine  Orajel overnight related to tooth pain.  She denies tooth pain at this time.    Subjective: Patient states I think I can go to [facility] but I am not sure if I want to go.  I do not really want to leave [place].  Patient insightful, she will consider residential substance use treatment options outside of [place] area.  Patient offered support and encouragement.  She is actively assisting disposition case management team with residential substance use treatment selection.  Diagnosis:  Final diagnoses:  Substance-induced  psychotic disorder (HCC)  Tactile hallucination  WPW (Wolff-Parkinson-White syndrome)  Methamphetamine dependence (HCC)  History of methylenedioxymethamphetamine (MDMA) use    Total Time spent with patient: 20 minutes Family History: Medical: paternal aunt has breast cancer,paternal grandfather and grandmother have HTN, paternal grandmother has diabetes. Strong family history of systemic lupus erythematosus (SLE). Family Psychiatric  History: Per patient's aunt: patient's father is reported to have had bipolar disorder, mother reportedly has schizophrenia/bipolar disorder Social History: Born in [place] , raised in [place]   has lived here ever since. Patient was unable to complete high school, went and lived with a boyfriend got pregnant and never finished school. Has a 71 year old son that lives with her aunt and then a 65 and 41 year old that live with her father. 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 Has been living in hotels.   Additional Social History:          Sleep: Good  Appetite:  Good  Current Medications:  Current Facility-Administered Medications  Medication Dose Route Frequency Provider Last Rate Last Admin   alum & mag hydroxide-simeth (MAALOX/MYLANTA) 200-200-20 MG/5ML suspension 30 mL  30 mL Oral Q4H PRN Delsie Lynwood Morene Lavone, MD       benzocaine  (ORAJEL) 10 % mucosal gel 1 Application  1 Application Mouth/Throat TID PRN Onuoha, Chinwendu V, NP   1 Application at 02/12/24 0247   haloperidol  (HALDOL ) tablet 5 mg  5 mg Oral TID PRN Delsie Lynwood Morene Lavone, MD       And   diphenhydrAMINE  (BENADRYL ) capsule 50 mg  50 mg Oral TID PRN Delsie Lynwood Morene Lavone, MD       haloperidol  lactate (HALDOL ) injection 5  mg  5 mg Intramuscular TID PRN Delsie Lynwood Morene Lavone, MD       And   diphenhydrAMINE  (BENADRYL ) injection 50 mg  50 mg Intramuscular TID PRN Delsie Lynwood Morene Lavone, MD        And   LORazepam  (ATIVAN ) injection 2 mg  2 mg Intramuscular TID PRN Delsie Lynwood Morene Lavone, MD       haloperidol  lactate (HALDOL ) injection 10 mg  10 mg Intramuscular TID PRN Delsie Lynwood Morene Lavone, MD       And   diphenhydrAMINE  (BENADRYL ) injection 50 mg  50 mg Intramuscular TID PRN Delsie Lynwood Morene Lavone, MD       And   LORazepam  (ATIVAN ) injection 2 mg  2 mg Intramuscular TID PRN Delsie Lynwood Morene Lavone, MD       hydrOXYzine  (ATARAX ) tablet 25 mg  25 mg Oral TID Hobson, Fran E, NP   25 mg at 02/11/24 2128   ibuprofen  (ADVIL ) tablet 200 mg  200 mg Oral Q6H PRN Onuoha, Chinwendu V, NP   200 mg at 02/12/24 0244   magnesium  hydroxide (MILK OF MAGNESIA) suspension 30 mL  30 mL Oral Daily PRN Delsie Lynwood Morene Lavone, MD       melatonin tablet 3 mg  3 mg Oral QHS PRN Ramzan, Mariam, NP   3 mg at 02/11/24 2129   nicotine  (NICODERM CQ  - dosed in mg/24 hours) patch 14 mg  14 mg Transdermal Q0600 Delsie Lynwood Morene Lavone, MD   14 mg at 02/11/24 9078   white petrolatum  (VASELINE) gel   Topical TID PRN Dasie Ellouise CROME, FNP   1 Application at 02/11/24 2128   Current Outpatient Medications  Medication Sig Dispense Refill   bacitracin  ointment Apply 1 Application topically 2 (two) times daily. (Patient not taking: Reported on 02/06/2024) 120 g 0   hydrOXYzine  (ATARAX ) 25 MG tablet Take 1 tablet (25 mg total) by mouth every 6 (six) hours. 12 tablet 0   permethrin  (ELIMITE ) 5 % cream Apply to affected area once (Patient taking differently: Apply 1 Application topically once. Apply to affected area once) 60 g 0   triamcinolone  cream (KENALOG ) 0.1 % Apply 1 Application topically 2 (two) times daily. 30 g 0    Labs  Lab Results:  Admission on 02/07/2024  Component Date Value Ref Range Status   HIV Screen 4th Generation wRfx 02/09/2024 Non Reactive  Non Reactive Final   Performed at [facility], [address] [place], [place] [address]   Preg  Test, Ur 02/08/2024 Negative  Negative Final  Admission on 02/06/2024, Discharged on 02/07/2024  Component Date Value Ref Range Status   Alcohol, Ethyl (B) 02/06/2024 <15  <15 mg/dL Final   Comment: (NOTE) For medical purposes only. Performed at [facility], [address] [place], [place] [address]    TSH 02/06/2024 1.166  0.350 - 4.500 uIU/mL Final   Comment: Performed by a 3rd Generation assay with a functional sensitivity of <=0.01 uIU/mL. Performed at [facility], [address], [place], [place] [address]    Prolactin 02/06/2024 8.4  4.8 - 33.4 ng/mL Final   Comment: (NOTE) Performed At: [facility] [address] [place], [place] [id number] Jennette Shorter MD Ey:[id number]    Neisseria Gonorrhea 02/06/2024 Negative   Final   Chlamydia 02/06/2024 Negative   Final   Comment 02/06/2024 Normal Reference Ranger Chlamydia - Negative   Final   Comment 02/06/2024 Normal Reference Range Neisseria  Gonorrhea - Negative   Final   RPR Ser Ql 02/06/2024 NON REACTIVE  NON REACTIVE Final   Performed at [facility], [address], [place], [place] [address]   ANA Ab, IFA 02/06/2024 Negative   Final   Comment: (NOTE)                                     Negative   <1:80                                     Borderline  1:80                                     Positive   >1:80 ICAP nomenclature: AC-0 For more information about Hep-2 cell patterns use ANApatterns.org, the official website for the International Consensus on Antinuclear Antibody (ANA) Patterns (ICAP). Performed At: [facility] [address] [place], [place] [id number] Jennette Shorter MD Ey:[id number]    Sed Rate 02/06/2024 4  0 - 22 mm/hr Final   Performed at [facility], [address] [place], [place] [address]   CRP 02/06/2024 <0.5  <1.0 mg/dL Final   Performed at [facility], [address], [place], [place] [address]   POC Amphetamine UR 02/06/2024 Positive (A)  NONE  DETECTED (Cut Off Level 1000 ng/mL) Final   POC Secobarbital (BAR) 02/06/2024 None Detected  NONE DETECTED (Cut Off Level 300 ng/mL) Final   POC Buprenorphine (BUP) 02/06/2024 None Detected  NONE DETECTED (Cut Off Level 10 ng/mL) Final   POC Oxazepam (BZO) 02/06/2024 None Detected  NONE DETECTED (Cut Off Level 300 ng/mL) Final   POC Cocaine UR 02/06/2024 None Detected  NONE DETECTED (Cut Off Level 300 ng/mL) Final   POC Methamphetamine UR 02/06/2024 Positive (A)  NONE DETECTED (Cut Off Level 1000 ng/mL) Final   POC Morphine 02/06/2024 None Detected  NONE DETECTED (Cut Off Level 300 ng/mL) Final   POC Methadone UR 02/06/2024 None Detected  NONE DETECTED (Cut Off Level 300 ng/mL) Final   POC Oxycodone  UR 02/06/2024 None Detected  NONE DETECTED (Cut Off Level 100 ng/mL) Final   POC Marijuana UR 02/06/2024 Positive (A)  NONE DETECTED (Cut Off Level 50 ng/mL) Final  Admission on 02/06/2024, Discharged on 02/06/2024  Component Date Value Ref Range Status   WBC 02/06/2024 6.7  4.0 - 10.5 K/uL Final   RBC 02/06/2024 4.34  3.87 - 5.11 MIL/uL Final   Hemoglobin 02/06/2024 12.8  12.0 - 15.0 g/dL Final   HCT 87/89/7974 38.7  36.0 - 46.0 % Final   MCV 02/06/2024 89.2  80.0 - 100.0 fL Final   MCH 02/06/2024 29.5  26.0 - 34.0 pg Final   MCHC 02/06/2024 33.1  30.0 - 36.0 g/dL Final   RDW 87/89/7974 14.8  11.5 - 15.5 % Final   Platelets 02/06/2024 435 (H)  150 - 400 K/uL Final   nRBC 02/06/2024 0.0  0.0 - 0.2 % Final   Performed at [facility], [address] [place], [place] [address]   Sodium 02/06/2024 138  135 - 145 mmol/L Final   Potassium 02/06/2024 3.9  3.5 - 5.1 mmol/L Final   Chloride 02/06/2024 105  98 - 111 mmol/L  Final   CO2 02/06/2024 24  22 - 32 mmol/L Final   Glucose, Bld 02/06/2024 92  70 - 99 mg/dL Final   Glucose reference range applies only to samples taken after fasting for at least 8 hours.   BUN 02/06/2024 12  6 - 20 mg/dL Final   Creatinine, Ser 02/06/2024 0.73  0.44 -  1.00 mg/dL Final   Calcium 87/89/7974 9.3  8.9 - 10.3 mg/dL Final   GFR, Estimated 02/06/2024 >60  >60 mL/min Final   Comment: (NOTE) Calculated using the CKD-EPI Creatinine Equation (2021)    Anion gap 02/06/2024 9  5 - 15 Final   Performed at [facility], [address], [place], [place] [address]   Total Protein 02/06/2024 7.1  6.5 - 8.1 g/dL Final   Albumin 87/89/7974 3.8  3.5 - 5.0 g/dL Final   AST 87/89/7974 22  15 - 41 U/L Final   ALT 02/06/2024 19  0 - 44 U/L Final   Alkaline Phosphatase 02/06/2024 42  38 - 126 U/L Final   Total Bilirubin 02/06/2024 0.4  0.0 - 1.2 mg/dL Final   Bilirubin, Direct 02/06/2024 <0.1  0.0 - 0.2 mg/dL Final   Indirect Bilirubin 02/06/2024 NOT CALCULATED  0.3 - 0.9 mg/dL Final   Performed at [facility], [address], [place], [place] [address]    Blood Alcohol level:  Lab Results  Component Value Date   [facility] <15 02/06/2024    Metabolic Disorder Labs: Lab Results  Component Value Date   HGBA1C 5.1 10/09/2021   MPG 99.67 10/09/2021   Lab Results  Component Value Date   PROLACTIN 8.4 02/06/2024   No results found for: CHOL, TRIG, HDL, CHOLHDL, VLDL, LDLCALC  Therapeutic Lab Levels: No results found for: LITHIUM No results found for: VALPROATE No results found for: CBMZ  Physical Findings   GAD-7    Flowsheet Row Routine [facility] Routine Prenatal from 10/13/2021 in [facility] Routine Prenatal from 06/30/2021 in [facility] Initial Prenatal from 06/01/2021 in [facility] [employer] [facility] Video Visit from 05/18/2021 in [facility]  Total GAD-7 Score 0 0 2 0 0   PHQ2-9    Flowsheet Row ED from 02/07/2024 in [facility] Routine Prenatal from 10/20/2021 in [facility] Routine Prenatal from 10/13/2021 in [facility] at [facility] Routine Prenatal from 06/30/2021 in [facility] at [facility] Initial Prenatal from 06/01/2021 in [facility]  PHQ-2 Total Score 2 0 2 0 0  PHQ-9 Total Score 10 3 4 2 1    Flowsheet Row ED from 02/07/2024 in [facility] Most recent reading at 02/11/2024 11:44 PM ED from 02/06/2024 in [facility] Most recent reading at 02/06/2024  2:10 PM ED from 02/06/2024 in [facility] Emergency Department at [facility] Most recent reading at 02/06/2024  6:55 AM  C-SSRS RISK CATEGORY No Risk Low Risk No Risk     Musculoskeletal  Strength & Muscle Tone: within normal limits Gait & Station: normal Patient leans: N/A  Psychiatric Specialty Exam  Presentation  General Appearance:  Appropriate for Environment; Casual  Eye Contact: Good  Speech: Clear and Coherent; Normal Rate  Speech Volume: Normal  Handedness: Right   Mood and Affect  Mood: Euthymic  Affect: Appropriate; Congruent   Thought Process  Thought Processes: Coherent; Goal Directed; Linear  Descriptions of Associations:Intact  Orientation:Full (Time, Place and Person)  Thought Content:Logical; WDL  Diagnosis of Schizophrenia or Schizoaffective disorder in past: No    Hallucinations:Hallucinations: None  Ideas of Reference:None  Suicidal Thoughts:Suicidal Thoughts: No  Homicidal Thoughts:Homicidal Thoughts: No   Sensorium  Memory: Immediate Good; Recent Fair  Judgment: Fair  Insight: Fair   Art Therapist  Concentration: Good  Attention Span: Good  Recall: Good  Fund of Knowledge: Fair  Language: Fair   Psychomotor  Activity  Psychomotor Activity: Psychomotor Activity: Normal   Assets  Assets: Communication Skills; Desire for Improvement; Financial Resources/Insurance; Resilience; Social Support; Physical Health   Sleep  Sleep: Sleep: Good  Estimated Sleeping Duration (Last 24 Hours): 7.00-8.75 hours  No data recorded  Physical Exam  Physical Exam Vitals and nursing note reviewed.  Constitutional:      Appearance: Normal appearance. She is well-developed.  HENT:     Head: Normocephalic and atraumatic.     Nose: Nose normal.  Cardiovascular:     Rate and Rhythm: Normal rate.  Pulmonary:     Effort: Pulmonary effort is normal.  Musculoskeletal:        General: Normal range of motion.     Cervical back: Normal range of motion.  Skin:    General: Skin is warm and dry.  Neurological:     Mental Status: She is alert and oriented to person, place, and time.  Psychiatric:        Attention and Perception: Attention and perception normal.        Mood and Affect: Mood and affect normal.        Speech: Speech normal.        Behavior: Behavior normal. Behavior is cooperative.        Thought Content: Thought content normal.        Cognition and Memory: Cognition and memory normal.        Judgment: Judgment normal.    Review of Systems  Constitutional: Negative.   HENT: Negative.    Eyes: Negative.   Respiratory: Negative.    Cardiovascular: Negative.   Gastrointestinal: Negative.   Genitourinary: Negative.   Musculoskeletal: Negative.   Skin: Negative.   Neurological: Negative.   Psychiatric/Behavioral:  Positive for substance abuse.    Blood pressure 132/80, pulse 87, temperature 98 F (36.7 C), temperature source Oral, resp. rate 18, last menstrual period 01/28/2024, SpO2 99%. There is no height or weight on file to calculate BMI.  Treatment Plan Summary: Daily contact with patient to assess and evaluate symptoms and progress in treatment  EKG 02/11/2024: Normal sinus  rhythm with sinus arrhythmia, ventricular pre-excitation, or WPW pattern type B.  QT/QTcB 374/426 MS.   Medication management:   Continue: -Continue agitation protocol as needed -Continue maalox 30 mL every 4 hours PRN for indigestion -Continue benzocaine  (Orajel) 10% mucosal gel 1 application mouth/throat 3 times daily as needed/mouth pain -Continue hydroxyzine  25 mg p.o. 3 times a day as needed for anxiety or itching -Continue ibuprofen  200 mg oral every 6 hours as needed/headache or mild pain -Continue melatonin 3 mg by mouth at bedtime PRN for sleep -Continue milk of magnesia 30 mL p.o. daily as needed for mild  constipation -Continue nicotine  14 mg patch transdermally daily for smoking cessation -Continue White petrolatum  Vaseline gel topical 3 times daily as needed/dry skin to bilateral hands.    Suspect WPW, Olanzapine  and trazodone  discontinued due to potential Wolff-Parkinson White syndrome which is associated with an increased risk of arrhythmias. Continued close monitoring for evidence of psychosis, initial hallucinations upon admission were in the context of substance induced psychosis with the use of meth and MDMA.  Education provided surrounding need to abstain from illicit substance use particularly stimulant use concern for accelerated heart rate, vasoconstriction, narrowing of vessels or cardiac damage.  Patient verbalizes understanding.   Plan Tentative discharge date of 12/17. Care coordination to assist with discharge planning. Will need referral to PCP and cardiology for follow-up for EKGs showing WPW.    Ellouise LITTIE Dawn, FNP 02/12/2024 10:50 AM

## 2024-02-12 NOTE — ED Notes (Signed)
 Pt received at the beginning of this shift seated in the community room with peers.. Pleasant and interacting appropriately with others.  Safety maintained .

## 2024-02-12 NOTE — ED Notes (Signed)
 Pt ate dinner. Pt discussed dc plan with social work as requested.

## 2024-02-12 NOTE — ED Notes (Signed)
 Pt currently attending and participating in group, no distress observed or reported. Pt ate lunch.

## 2024-02-12 NOTE — Group Note (Signed)
 Group Topic: Relapse and Recovery  Group Date: 02/12/2024 Start Time: 1300 End Time: 1330 Facilitators: Clodfelter-Simmons, Naeemah Jasmer L, NT  Department: [facility]  Number of Participants: 7  Group Focus: relapse prevention Treatment Modality:  Psychoeducation Interventions utilized were problem solving Purpose: relapse prevention strategies  Name: AMARISS DETAMORE Date of Birth: [birth year]  MR: [id number]    Level of Participation: active Quality of Participation: attentive Interactions with others: gave feedback Mood/Affect: appropriate Triggers (if applicable): health problems Cognition: coherent/clear Progress: Gaining insight Response: tired of hurting family Plan: patient will be encouraged to get self esteem back.  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

## 2024-02-13 DIAGNOSIS — F19959 Other psychoactive substance use, unspecified with psychoactive substance-induced psychotic disorder, unspecified: Secondary | ICD-10-CM | POA: Diagnosis not present

## 2024-02-13 DIAGNOSIS — F1520 Other stimulant dependence, uncomplicated: Secondary | ICD-10-CM | POA: Diagnosis not present

## 2024-02-13 DIAGNOSIS — R442 Other hallucinations: Secondary | ICD-10-CM | POA: Diagnosis not present

## 2024-02-13 DIAGNOSIS — R45851 Suicidal ideations: Secondary | ICD-10-CM | POA: Diagnosis not present

## 2024-02-13 MED ORDER — MELATONIN 3 MG PO TABS: 3.0000 mg | ORAL_TABLET | Freq: Every evening | ORAL | 0 refills | Status: AC | PRN

## 2024-02-13 MED ORDER — HYDROXYZINE HCL 25 MG PO TABS: 25.0000 mg | ORAL_TABLET | Freq: Three times a day (TID) | ORAL | 0 refills | Status: AC | PRN

## 2024-02-13 MED ORDER — NICOTINE 14 MG/24HR TD PT24: 14.0000 mg | MEDICATED_PATCH | Freq: Every day | TRANSDERMAL | 0 refills | Status: AC

## 2024-02-13 NOTE — ED Notes (Signed)
 Patient A&O x 4, ambulatory. Patient discharged in no acute distress. Patient denied SI/HI, A/VH upon discharge. Patient verbalized understanding of all discharge instructions reviewed on AVS via staff, to include follow up appointments, RX's and safety. Suicide safety plan completed and reviewed with Clinical research associate. A copy given to pt. Patient reported mood 10/10.  Pt belongings returned to patient from locker #23 intact. Patient escorted to lobby via staff for transport to destination. Safety maintained.

## 2024-02-13 NOTE — Care Management (Addendum)
 [facility] Care Management...  Addendum 9:56 am  Patient requesting discharge  Patient will discharge today 02/13/2024 to [name] home ...  [address]  [place] [address]   [name] will pick patient up  7 day samples 30 day scripts  Per Joen @ [email or web address]  Patient has been accepted to their program. Patient scheduled for intake 02/14/2024 @ 9:30 am    [facility] [address]  [place], [place] [address] Phone: [phone]   7 day sample and  30 day scripts   Writer met with patient to discuss discharge planning  Writer discussed with patient her discharge options....  Possible acceptance to [facility], per Rosina they received update provider note and sent to clinical for review Possible discharge to [facility] home and patient to follow up with [facility], if we have not heard from them Send referral for CD-IOP and out patient therapy  Writer will follow up with [facility] @ 9:30 am

## 2024-02-13 NOTE — ED Notes (Signed)
 Pt sleeping long intervals post med pass compliant with medications.  Complained of toothpain received ibuprofen  and oral- jel for comfort with relief indicated.  Affect was pleasant prior to bedtime.  Safety maintained.

## 2024-02-13 NOTE — ED Notes (Signed)
 Patient A&Ox4. Denies intent to harm self/others when asked. Denies A/VH. Patient denies any physical complaints when asked. No acute distress noted. Support and encouragement provided. Routine safety checks conducted according to facility protocol. Encouraged patient to notify staff if thoughts of harm toward self or others arise. Patient verbalize understanding and agreement. Pt request outpatient services upon discharge. Provider and SW made aware. Will continue to monitor for safety.

## 2024-02-13 NOTE — ED Notes (Signed)
 Pt appears to be sleeping , no interruptions of discomfort. Safety maintained.

## 2024-02-15 LAB — THC,MS,WB/SP RFX
Cannabidiol: NEGATIVE
Cannabinoid Confirmation: POSITIVE
Carboxy-THC: 31.4 ng/mL
Hydroxy-THC: NEGATIVE ng/mL
Tetrahydrocannabinol(THC): 3.9 ng/mL

## 2024-02-17 LAB — DRUG SCREEN 10 W/CONF, SERUM
Amphetamines, IA: POSITIVE ng/mL — AB
Barbiturates, IA: NEGATIVE ug/mL
Benzodiazepines, IA: NEGATIVE ng/mL
Cocaine & Metabolite, IA: NEGATIVE ng/mL
Methadone, IA: NEGATIVE ng/mL
Opiates, IA: NEGATIVE ng/mL
Oxycodones, IA: NEGATIVE ng/mL
Phencyclidine, IA: NEGATIVE ng/mL
Propoxyphene, IA: NEGATIVE ng/mL
THC(Marijuana) Metabolite, IA: POSITIVE ng/mL — AB

## 2024-02-17 LAB — AMPHETAMINES/MDMA,MS,WB/SP RFX
Amphetamine: 13 ng/mL
Amphetamines Confirmation: POSITIVE
MDA: NEGATIVE ng/mL
MDEA: NEGATIVE ng/mL
MDMA: NEGATIVE ng/mL
Methamphetamine: 81 ng/mL

## 2024-02-22 ENCOUNTER — Encounter (HOSPITAL_COMMUNITY): Payer: Self-pay

## 2024-02-22 ENCOUNTER — Telehealth: Payer: Self-pay | Admitting: *Deleted

## 2024-02-22 ENCOUNTER — Emergency Department (HOSPITAL_COMMUNITY)
Admission: EM | Admit: 2024-02-22 | Discharge: 2024-02-22 | Disposition: A | Discharge status: Home / Self Care | Payer: Self-pay | Attending: Emergency Medicine | Admitting: Emergency Medicine

## 2024-02-22 ENCOUNTER — Other Ambulatory Visit: Payer: Self-pay

## 2024-02-22 DIAGNOSIS — B86 Scabies: Secondary | ICD-10-CM | POA: Insufficient documentation

## 2024-02-22 DIAGNOSIS — F419 Anxiety disorder, unspecified: Secondary | ICD-10-CM | POA: Insufficient documentation

## 2024-02-22 MED ORDER — PERMETHRIN 5 % EX CREA: TOPICAL_CREAM | CUTANEOUS | 1 refills | Status: AC

## 2024-02-22 NOTE — Discharge Instructions (Addendum)
 Please read the attached information about scabies.  It is important for you to follow-up with a primary care provider.  I have given you the information for the [facility].  Please follow-up with them.

## 2024-02-22 NOTE — ED Provider Notes (Signed)
 " [place] EMERGENCY DEPARTMENT AT [facility] Provider Note   CSN: [id number] Arrival date & time: 02/22/24  9377     Patient presents with: Rash   Madison Soto is a 28 y.o. female.    Rash  Patient is a 28 year old female present emergency room today with complaints of itching between fingers of bilateral hands.  She states that she was diagnosed with scabies in the past and is concerned that she has some currently.  She has excoriations between her fingers.  I reviewed the chart and it does appear that she has a history of delusional parasitosis and she has had labs done with normal kidney function nonuremic's in the past 2 weeks.  Notably was positive for amphetamines at that time as well.       Prior to Admission medications  Medication Sig Start Date End Date Taking? Authorizing Provider  permethrin  (ELIMITE ) 5 % cream Apply to affected area once 02/22/24  Yes Catilyn Boggus S, PA  hydrOXYzine  (ATARAX ) 25 MG tablet Take 1 tablet (25 mg total) by mouth every 8 (eight) hours as needed. 02/13/24   Tex Drilling, NP  melatonin 3 MG TABS tablet Take 1 tablet (3 mg total) by mouth at bedtime as needed. 02/13/24   Tex Drilling, NP  nicotine  (NICODERM CQ  - DOSED IN MG/24 HOURS) 14 mg/24hr patch Place 1 patch (14 mg total) onto the skin daily at 6 (six) AM. 02/13/24   Tex Drilling, NP    Allergies: Patient has no known allergies.    Review of Systems  Skin:  Positive for rash.    Updated Vital Signs BP 123/78 (BP Location: Right Arm)   Pulse (!) 105   Temp 98.1 F (36.7 C) (Oral)   Resp 18   LMP 02/22/2024 (Exact Date)   SpO2 100%   Physical Exam Vitals and nursing note reviewed.  Constitutional:      General: She is not in acute distress.    Appearance: Normal appearance. She is not ill-appearing.  HENT:     Head: Normocephalic and atraumatic.  Eyes:     General: No scleral icterus.       Right eye: No discharge.        Left eye: No  discharge.     Conjunctiva/sclera: Conjunctivae normal.  Pulmonary:     Effort: Pulmonary effort is normal.     Breath sounds: No stridor.  Skin:    Comments: Interdigital excoriations  Neurological:     Mental Status: She is alert and oriented to person, place, and time. Mental status is at baseline.     (all labs ordered are listed, but only abnormal results are displayed) Labs Reviewed - No data to display  EKG: None  Radiology: No results found.   Procedures   Medications Ordered in the ED - No data to display                                  Medical Decision Making Risk Prescription drug management.   Patient is a 28 year old female present emergency room today with complaints of itching between fingers of bilateral hands.  She states that she was diagnosed with scabies in the past and is concerned that she has some currently.  She has excoriations between her fingers.  I reviewed the chart and it does appear that she has a history of delusional parasitosis and she has had labs  done with normal kidney function nonuremic's in the past 2 weeks.  Notably was positive for amphetamines at that time as well.  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  However she is requesting permethrin  and I do not see any significant downsides to her using this especially because she does have itching between her fingers.  Provided her with some printed information about scabies and will discharge her home.  Final diagnoses:  Scabies  Anxiety    ED Discharge Orders          Ordered    permethrin  (ELIMITE ) 5 % cream        02/22/24 0809               Neldon Hamp RAMAN, PA 02/22/24 9074    Kingsley, Victoria K, DO 02/22/24 1149  "

## 2024-02-22 NOTE — Telephone Encounter (Signed)
 Pt called regarding misplacing her Elimite  Rx and requesting refill.  RNCM contacted pharmacy to refill as written.  Daneshia Tavano J. Debarah, BSN, RN, [facility]  Inpatient Care Management  Nurse Case Manager  [facility] Emergency Departments  Operative Services  [phone]

## 2024-02-22 NOTE — ED Triage Notes (Signed)
 Pt recently diagnosed with scabies and thinks that she needs more medication.
